# Patient Record
Sex: Female | Born: 1990 | Race: White | Hispanic: No | Marital: Married | Smoking: Never smoker
Health system: Southern US, Community
[De-identification: ages and names within clinical notes are randomized; demographics above are authoritative.]

## PROBLEM LIST (undated history)

## (undated) DIAGNOSIS — R51 Headache: Secondary | ICD-10-CM

## (undated) DIAGNOSIS — O09299 Supervision of pregnancy with other poor reproductive or obstetric history, unspecified trimester: Secondary | ICD-10-CM

## (undated) DIAGNOSIS — K900 Celiac disease: Secondary | ICD-10-CM

## (undated) DIAGNOSIS — R569 Unspecified convulsions: Secondary | ICD-10-CM

## (undated) DIAGNOSIS — F32A Depression, unspecified: Secondary | ICD-10-CM

## (undated) DIAGNOSIS — F419 Anxiety disorder, unspecified: Secondary | ICD-10-CM

## (undated) DIAGNOSIS — F909 Attention-deficit hyperactivity disorder, unspecified type: Secondary | ICD-10-CM

## (undated) DIAGNOSIS — F429 Obsessive-compulsive disorder, unspecified: Secondary | ICD-10-CM

## (undated) HISTORY — DX: Headache: R51

## (undated) HISTORY — DX: Unspecified convulsions: R56.9

## (undated) HISTORY — DX: Anxiety disorder, unspecified: F41.9

## (undated) HISTORY — PX: OTHER SURGICAL HISTORY: SHX169

## (undated) HISTORY — DX: Supervision of pregnancy with other poor reproductive or obstetric history, unspecified trimester: O09.299

## (undated) HISTORY — PX: TONSILLECTOMY: SUR1361

## (undated) HISTORY — DX: Obsessive-compulsive disorder, unspecified: F42.9

## (undated) HISTORY — DX: Attention-deficit hyperactivity disorder, unspecified type: F90.9

## (undated) HISTORY — DX: Depression, unspecified: F32.A

## (undated) HISTORY — PX: TOOTH EXTRACTION: SUR596

---
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
## 2010-01-02 ENCOUNTER — Ambulatory Visit: Payer: Self-pay | Admitting: Pediatrics

## 2010-07-03 ENCOUNTER — Institutional Professional Consult (permissible substitution) (INDEPENDENT_AMBULATORY_CARE_PROVIDER_SITE_OTHER): Payer: BC Managed Care – PPO | Admitting: Pediatrics

## 2010-07-03 DIAGNOSIS — F411 Generalized anxiety disorder: Secondary | ICD-10-CM

## 2010-07-03 DIAGNOSIS — F909 Attention-deficit hyperactivity disorder, unspecified type: Secondary | ICD-10-CM

## 2011-03-24 ENCOUNTER — Institutional Professional Consult (permissible substitution) (INDEPENDENT_AMBULATORY_CARE_PROVIDER_SITE_OTHER): Payer: BC Managed Care – PPO | Admitting: Pediatrics

## 2011-03-24 DIAGNOSIS — F909 Attention-deficit hyperactivity disorder, unspecified type: Secondary | ICD-10-CM

## 2011-03-24 DIAGNOSIS — F411 Generalized anxiety disorder: Secondary | ICD-10-CM

## 2011-04-26 ENCOUNTER — Institutional Professional Consult (permissible substitution) (INDEPENDENT_AMBULATORY_CARE_PROVIDER_SITE_OTHER): Payer: BC Managed Care – PPO | Admitting: Pediatrics

## 2011-04-26 DIAGNOSIS — F411 Generalized anxiety disorder: Secondary | ICD-10-CM

## 2011-10-13 ENCOUNTER — Institutional Professional Consult (permissible substitution) (INDEPENDENT_AMBULATORY_CARE_PROVIDER_SITE_OTHER): Payer: BC Managed Care – PPO | Admitting: Pediatrics

## 2011-10-13 DIAGNOSIS — F411 Generalized anxiety disorder: Secondary | ICD-10-CM

## 2011-10-13 DIAGNOSIS — F909 Attention-deficit hyperactivity disorder, unspecified type: Secondary | ICD-10-CM

## 2012-01-26 ENCOUNTER — Institutional Professional Consult (permissible substitution) (INDEPENDENT_AMBULATORY_CARE_PROVIDER_SITE_OTHER): Payer: BC Managed Care – PPO | Admitting: Pediatrics

## 2012-01-26 DIAGNOSIS — F411 Generalized anxiety disorder: Secondary | ICD-10-CM

## 2012-01-26 DIAGNOSIS — F909 Attention-deficit hyperactivity disorder, unspecified type: Secondary | ICD-10-CM

## 2012-01-27 ENCOUNTER — Institutional Professional Consult (permissible substitution): Payer: BC Managed Care – PPO | Admitting: Pediatrics

## 2012-04-20 ENCOUNTER — Institutional Professional Consult (permissible substitution) (INDEPENDENT_AMBULATORY_CARE_PROVIDER_SITE_OTHER): Payer: BC Managed Care – PPO | Admitting: Pediatrics

## 2012-04-20 DIAGNOSIS — F411 Generalized anxiety disorder: Secondary | ICD-10-CM

## 2012-04-20 DIAGNOSIS — F909 Attention-deficit hyperactivity disorder, unspecified type: Secondary | ICD-10-CM

## 2012-05-10 ENCOUNTER — Encounter (HOSPITAL_COMMUNITY): Payer: Self-pay | Admitting: Psychiatry

## 2012-05-10 ENCOUNTER — Ambulatory Visit (INDEPENDENT_AMBULATORY_CARE_PROVIDER_SITE_OTHER): Payer: BC Managed Care – PPO | Admitting: Psychiatry

## 2012-05-10 DIAGNOSIS — F3289 Other specified depressive episodes: Secondary | ICD-10-CM

## 2012-05-10 DIAGNOSIS — F329 Major depressive disorder, single episode, unspecified: Secondary | ICD-10-CM

## 2012-05-10 DIAGNOSIS — F429 Obsessive-compulsive disorder, unspecified: Secondary | ICD-10-CM

## 2012-05-12 NOTE — Patient Instructions (Signed)
Discussed orally 

## 2012-05-12 NOTE — Progress Notes (Signed)
Patient:   Theresa Jackson   DOB:   1990/09/22  MR Number:  [id number]  Location:  [address] [place], [place] [address]  Date of Service:   Wednesday 05/10/2012  Start Time:   10:20 AM End Time:   11:15 AM  Provider/Observer:  Florencia Reasons, MSW, LCSW   Billing Code/Service:  [phone]  Chief Complaint:     Chief Complaint  Patient presents with  . Anxiety  . Depression    Reason for Service:  The patient is referred for services by Dr Loraine Leriche due to patient experiencing symptoms of anxiety and depression. Patient  reports being diagnosed with ADHD, OCD, and CAPD by Dr. Kem Kays in the 6th grade. Symptoms have worsened as she has aged. She  states being so "paranoid"  about her past that it is interfering with her daily functioning. She reports sending pictures of herself dressed in her underwear to a guy 3 or 4 years ago. She now constantly fears he will share the pictures, she will lose her job,  and go to jail. She also reports a guy sent her pictures of himself naked several years ago. She checks her flash drive and her phone constantly to make certain the pictures aren't there. Patient also reports thinking if she doesn't touch her flash drive a certain number of times, something bad is going to happen She checks electronics and appliances in her home several times in a certain order every morning and says this process takes about 25 minutes. 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 She also shares memories from childhood where she reports worrying and pulling her hair so frequently that she had open sores in her head. She also remembers asking her father to check her pocketbook each night before she went to bed. Patient reports being so overwhelmed with anxiety and her OCD tendencies that she becomes depressed and thinks she shouldn't  be here.   Current Status:  The patient  reports depressed mood, anxiety, racing thoughts, irritability, excessive worrying, low energy, excessive thoughts, ritualistic behaviors, poor concentration, and hyperactivity  Reliability of Information: Reliable  Behavioral Observation: Theresa Jackson  presents as a 22 y.o.-year-old right handed Caucasian Female who appeared her stated age. Her dress was appropriate and she was casual in attire.  Her  manners were appropriate to the situation.  There were not any physical disabilities noted.  She displayed an appropriate level of cooperation and motivation.    Interactions:    Active   Attention:   within normal limits  Memory:   Immediate memory - recalled 2/3 words.  Visuo-spatial:   within normal limits  Speech (Volume):  normal  Speech:   normal pitch and normal volume  Thought Process:  Coherent and Relevant  Though Content:  Obsessions  Orientation:   person, place, time/date, situation, day of week, month of year and year  Judgment:   Good  Planning:   Good  Affect:    Anxious  Mood:    Anxious and Depressed  Insight:   Fair  Intelligence:   normal  Marital Status/Living: The patient was born in [place] and reared in [place]. She is the oldest of 2 siblings. She describes her household in childhood as  family was happy, they camped and did fun things. Patient and her husband have been married for 5 months. They reside in  [place]. They have no children.  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  Past Employment:   Substance Use:  No concerns of substance abuse are reported.   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  Medical History:  History reviewed. No pertinent past medical history.  Sexual History:   History  Sexual Activity  . Sexually Active:     Abuse/Trauma History: Denies  Psychiatric  History:  Patient reports no psychiatric hospitalizations. She has been seeing Dr.Thomas Kem Kays for outpatient treatment since she was in the 6th grade at which time she was diagnosed with ADHD, OCD, and CAPD. Patient has taken Focalin and Strattera in the past. She currently is taking Zoloft and buspirone as prescribed by Dr. Kem Kays.   Family Med/Psych History:  Family History  Problem Relation Age of Onset  . Depression Maternal Grandfather     Risk of Suicide/Violence: Patient denies any suicidal attempts. She admits hopelessness at times but denies past and current suicidal ideations. She denies past and current homicidal ideations. She reports no history of aggression or violence,  Impression/DX:  The patient presents with a long-standing history of symptoms of anxiety that have worsened with age. She currently is experiencing obsessions and compulsions that are interfering with her daily functioning and consuming a significant amount of time. Patient also is experiencing depressed mood related to feeling overwhelmed with the obsessions and compulsions. Patient also presents with a history of being diagnosed with ADHD and CAPD. Diagnoses: Obsessive-compulsive disorder, depressive disorder NOS, ADHD by history  Disposition/Plan:  Patient attends the assessment appointment today. Confidentiality and limits are discussed. The patient agrees to return for an appointment in one week for continuing assessment and treatment planning. She currently is taking buspirone and Zoloft as prescribed by Dr. Loraine Leriche and will continue to see him for medication management. The patient agrees to call this practice, call 911, or have someone take her to the emergency room should symptoms worsen.  Diagnosis:    Axis I:   1. OCD (obsessive compulsive disorder)   2. Depressive disorder, not elsewhere classified         Axis II: Deferred       Axis III:  See medical history      Axis IV:            Axis V:   51-60 moderate symptoms

## 2012-05-18 ENCOUNTER — Ambulatory Visit (HOSPITAL_COMMUNITY): Payer: Self-pay | Admitting: Psychiatry

## 2012-05-25 ENCOUNTER — Ambulatory Visit (INDEPENDENT_AMBULATORY_CARE_PROVIDER_SITE_OTHER): Payer: BC Managed Care – PPO | Admitting: Psychiatry

## 2012-05-25 DIAGNOSIS — F429 Obsessive-compulsive disorder, unspecified: Secondary | ICD-10-CM

## 2012-05-25 DIAGNOSIS — F329 Major depressive disorder, single episode, unspecified: Secondary | ICD-10-CM

## 2012-05-25 NOTE — Progress Notes (Signed)
Patient:  Theresa Jackson   DOB: [birth year]  MR Number: [id number]  Location: [facility]:  [address] [place],  [place], [address]  Start: Thursday 05/25/2012 9:10 AM End: Thursday 05/25/2012 10:00 AM  Provider/Observer:     Florencia Reasons, MSW, LCSW   Chief Complaint:      Chief Complaint  Patient presents with  . Anxiety  . Depression   Reason For Service:      The patient is referred for services by Dr Loraine Leriche due to patient experiencing symptoms of anxiety and depression. 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 Symptoms have worsened as she has aged. She states being so "paranoid" about her past that it is interfering with her daily functioning. She reports sending pictures of herself dressed in her underwear to a guy 3 or 4 years ago. She now constantly fears he will share the pictures, she will lose her job, and go to jail. She also reports a guy sent her pictures of himself naked several years ago. She checks her flash drive and her phone constantly to make certain the pictures aren't there. Patient also reports thinking if she doesn't touch her flash drive a certain number of times, something bad is going to happen She checks electronics and appliances in her home several times in a certain order every morning and says this process takes about 25 minutes. 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 She also shares memories from childhood where she reports worrying and pulling her hair so frequently that she had open sores in her head. She also remembers asking her father to check her pocketbook each night before she went to bed. Patient reports being so overwhelmed with anxiety and her OCD tendencies that she becomes depressed and thinks she shouldn't be here. Patient is seen for follow up  appointment.  Interventions Strategy:  Supportive therapy  Participation Level:   Active  Participation Quality:  Appropriate      Behavioral Observation:  Casual, Alert, and Appropriate.   Current Psychosocial Factors: Patient reports stress regarding financial issues in her marriage. She also reports work related stress.  Content of Session:   Establishing rapport, reviewing symptoms, processing feelings, identifying ways to improve self-care regarding  nutrition and exercise.  Current Status:   The patient reports less depressed mood and less worry about her past but continued  anxiety, racing thoughts,  excessive worrying, ritualistic behaviors, poor concentration, and hyperactivity  Patient Progress:   Fair. Patient reports feeling better since last session. She states being less paranoid about her past since being able to talk in session. She continues to report stress related to her job. She also shares with therapist that she experiences mood swings and states sometimes being  "super happy" and sometimes "super sad". When happy, she reports being hyperactive, running around, and being unable to slow down  been sad, she reports losing interest in sex and being down but also wanting to clean excessively..Patient also reports worry that she seems to be hungry all the time. She fears weight gain. She reports exercising and eating healthy.  Target Goals:   Establishing rapport  Last Reviewed:    Goals Addressed Today:    Establishing rapport  Impression/Diagnosis:   *The patient presents with a long-standing history of symptoms of anxiety that have worsened  with age. She currently is experiencing obsessions and compulsions that are interfering with her daily functioning and consuming a significant amount of time. Patient also is experiencing depressed mood related to feeling overwhelmed with the obsessions and compulsions. Patient also shares a history of mood swings and states being super  happy [place] being super sad. Patient also presents with a history of being diagnosed with ADHD and CAPD.  Diagnoses: Obsessive-compulsive disorder, depressive disorder NOS, rule out bipolar disorder ADHD by history   Diagnosis:  Axis I: OCD (obsessive compulsive disorder)  Depressive disorder, not elsewhere classified          Axis II: Deferred

## 2012-05-25 NOTE — Patient Instructions (Signed)
Discussed orally 

## 2012-06-01 ENCOUNTER — Ambulatory Visit (HOSPITAL_COMMUNITY): Payer: Self-pay | Admitting: Psychiatry

## 2012-06-02 ENCOUNTER — Ambulatory Visit (INDEPENDENT_AMBULATORY_CARE_PROVIDER_SITE_OTHER): Payer: BC Managed Care – PPO | Admitting: Psychiatry

## 2012-06-02 DIAGNOSIS — F429 Obsessive-compulsive disorder, unspecified: Secondary | ICD-10-CM

## 2012-06-02 DIAGNOSIS — F329 Major depressive disorder, single episode, unspecified: Secondary | ICD-10-CM

## 2012-06-02 NOTE — Progress Notes (Addendum)
Patient:  Theresa Jackson   DOB: Jun 09, 1990  MR Number: [id number]  Location: [facility]:  [address] [place],  [place], [address]  Start: Friday 06/02/2012 9:00 AM End: Friday 06/02/2012 9:55 AM  Provider/Observer:     Florencia Reasons, MSW, LCSW   Chief Complaint:      Chief Complaint  Patient presents with  . Anxiety  . Depression   Reason For Service:      The patient is referred for services by Dr Loraine Leriche due to patient experiencing symptoms of anxiety and depression. 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 Symptoms have worsened as she has aged. She states being so "paranoid" about her past that it is interfering with her daily functioning. She reports sending pictures of herself dressed in her underwear to a guy 3 or 4 years ago. She now constantly fears he will share the pictures, she will lose her job, and go to jail. She also reports a guy sent her pictures of himself naked several years ago. She checks her flash drive and her phone constantly to make certain the pictures aren't there. Patient also reports thinking if she doesn't touch her flash drive a certain number of times, something bad is going to happen She checks electronics and appliances in her home several times in a certain order every morning and says this process takes about 25 minutes. 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 She also shares memories from childhood where she reports worrying and pulling her hair so frequently that she had open sores in her head. She also remembers asking her father to check her pocketbook each night before she went to bed. Patient reports being so overwhelmed with anxiety and her OCD tendencies that she becomes depressed and thinks she shouldn't be here. Patient is seen for follow up appointment.  Interventions  Strategy:  Supportive therapy,   Participation Level:   Active  Participation Quality:  Appropriate      Behavioral Observation:  Casual, Alert, and Appropriate.   Current Psychosocial Factors: Patient reports recent argument with husband. She reports work related stress to interaction with a co-worker.  Content of Session:    reviewing symptoms, processing feelings, developing treatment plan, practicing relaxation technique, discussing referral to psychiatrist for medication evaluation  Current Status:   The patient reports improved mood but continued  anxiety, racing thoughts,  excessive worrying, ritualistic behaviors, poor concentration, and hyperactivity  Patient Progress:   Fair. 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 She also reports stress related to a coworker who tends to nag. She reports additional stress related to her marriage and reports that her husband can be very controlling. She also expresses frustration that husband is not more involved with performing household chores. Therapist works with patient to process her feelings and to practice a relaxation technique using diaphragmatic breathing. Patient continues to experience obsessions and compulsions along with paranoia. She reports feeling bad about her thoughts and reports poor self-esteem. She also reports often taking the blame in arguments in her marriage. Therapist works with patient to develop a treatment plan.   Target Goals:  Patient's words: "Stop checking everything, stop having weird thoughts in my head, stop letting  people get to me, increase my  self-esteem"  1. Decrease intensity and frequency of obsessions and compulsions: 1:1 psychotherapy one time every 1-4 weeks (supportive, cognitive behavioral therapy)   2. Improve assertiveness skills and ability to set and maintain boundaries: 1:1 psychotherapy one time every 1-4 weeks (supportive,  cognitive behavior therapy)   3. Increase self acceptance: 1:1 psychotherapy one time every 1-4 weeks (supportive, cognitive behavioral therapy)  Last Reviewed:  06/02/2012   Goals Addressed Today:    Goal 1  Impression/Diagnosis:   *The patient presents with a long-standing history of symptoms of anxiety that have worsened with age. She currently is experiencing obsessions and compulsions that are interfering with her daily functioning and consuming a significant amount of time. Patient also is experiencing depressed mood related to feeling overwhelmed with the obsessions and compulsions. Patient also shares a history of mood swings and states being super happy [place] being super sad. Patient also presents with a history of being diagnosed with ADHD and CAPD.  Diagnoses: Obsessive-compulsive disorder, depressive disorder NOS, rule out bipolar disorder ADHD by history   Diagnosis:  Axis I: OCD (obsessive compulsive disorder)  Depressive disorder, not elsewhere classified          Axis II: Deferred

## 2012-06-02 NOTE — Patient Instructions (Signed)
Discussed orally 

## 2012-06-05 ENCOUNTER — Ambulatory Visit (INDEPENDENT_AMBULATORY_CARE_PROVIDER_SITE_OTHER): Payer: BC Managed Care – PPO | Admitting: Psychiatry

## 2012-06-05 DIAGNOSIS — F429 Obsessive-compulsive disorder, unspecified: Secondary | ICD-10-CM

## 2012-06-05 DIAGNOSIS — F329 Major depressive disorder, single episode, unspecified: Secondary | ICD-10-CM

## 2012-06-05 NOTE — Progress Notes (Addendum)
Patient:  Theresa Jackson   DOB: [birth year]  MR Number: [id number]  Location: [facility]:  [address] [place],  [place], [address]  Start: Monday 06/05/2012 9:10 AM End: Monday 06/05/2012 10:00 AM  Provider/Observer:     Florencia Reasons, MSW, LCSW   Chief Complaint:      Chief Complaint  Patient presents with  . Anxiety  . Depression   Reason For Service:      The patient is referred for services by Dr Loraine Leriche due to patient experiencing symptoms of anxiety and depression. 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 Symptoms have worsened as she has aged. She states being so "paranoid" about her past that it is interfering with her daily functioning. She reports sending pictures of herself dressed in her underwear to a guy 3 or 4 years ago. She now constantly fears he will share the pictures, she will lose her job, and go to jail. She also reports a guy sent her pictures of himself naked several years ago. She checks her flash drive and her phone constantly to make certain the pictures aren't there. Patient also reports thinking if she doesn't touch her flash drive a certain number of times, something bad is going to happen She checks electronics and appliances in her home several times in a certain order every morning and says this process takes about 25 minutes. 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 She also shares memories from childhood where she reports worrying and pulling her hair so frequently that she had open sores in her head. She also remembers asking her father to check her pocketbook each night before she went to bed. Patient reports being so overwhelmed with anxiety and her OCD tendencies that she becomes depressed and thinks she shouldn't be here. Patient is seen for follow up appointment.  Interventions  Strategy:  Supportive therapy,   Participation Level:   Active  Participation Quality:  Appropriate      Behavioral Observation:  Casual, Alert, and anxious, depressed  Current Psychosocial Factors: Patient reports recent argument with husband.   Content of Session:    Reviewing symptoms, processing feelings, beginning to explore thought patterns and effects on mood and behavior, encouraging patient to use relaxation technique  Current Status:   The patient reports depressed mood and  continued  anxiety, racing thoughts,  excessive worrying, ritualistic behaviors, poor concentration, and hyperactivity  Patient Progress:   Fair. Patient reports increased stress this weekend as she and her husband continued to experience marital discord. Patient states her husband" puts her down". Therapist works with patient to explore her thought patterns and effects on patient's mood and behavior. Patient reports poor self-esteem since childhood and states thinking she isn't as good as other people. Patient shares negative experiences from childhood. Therapist works with patient to process her feelings as well as to identify strengths patient used during childhood and effects on patient's current functioning. Therapist also begins to work with patient to identify specific obsessions and compulsions. Therapist and patient also discuss patient scheduling an earlier appointment with Dr. Dan Humphreys as patient will run out of medication this Friday.  Target Goals:  Patient's words: "Stop checking everything, stop having weird thoughts in my head, stop letting  people get to me, increase my  self-esteem"  1. Decrease intensity and frequency of obsessions and compulsions: 1:1 psychotherapy one time every 1-4 weeks (supportive, cognitive behavioral therapy)   2. Improve assertiveness skills and ability to set and maintain boundaries: 1:1 psychotherapy one time every 1-4 weeks (supportive, cognitive behavior  therapy)   3. Increase self acceptance: 1:1 psychotherapy one time every 1-4 weeks (supportive, cognitive behavioral therapy)  Last Reviewed:  06/02/2012   Goals Addressed Today:    Goal 3  Impression/Diagnosis:   The patient presents with a long-standing history of symptoms of anxiety that have worsened with age. She currently is experiencing obsessions and compulsions that are interfering with her daily functioning and consuming a significant amount of time. Patient also is experiencing depressed mood related to feeling overwhelmed with the obsessions and compulsions. Patient also shares a history of mood swings and states being super happy and then being super sad. Patient also presents with a history of being diagnosed with ADHD and CAPD.  Diagnoses: Obsessive-compulsive disorder, depressive disorder NOS, rule out bipolar disorder ADHD by history   Diagnosis:  Axis I: OCD (obsessive compulsive disorder)  Depressive disorder, not elsewhere classified          Axis II: Deferred

## 2012-06-05 NOTE — Patient Instructions (Signed)
Discussed orally 

## 2012-06-09 ENCOUNTER — Ambulatory Visit (HOSPITAL_COMMUNITY): Payer: Self-pay | Admitting: Psychiatry

## 2012-06-13 ENCOUNTER — Ambulatory Visit (HOSPITAL_COMMUNITY): Payer: Self-pay | Admitting: Psychiatry

## 2012-06-16 ENCOUNTER — Ambulatory Visit (INDEPENDENT_AMBULATORY_CARE_PROVIDER_SITE_OTHER): Payer: BC Managed Care – PPO | Admitting: Psychiatry

## 2012-06-16 ENCOUNTER — Encounter (HOSPITAL_COMMUNITY): Payer: Self-pay | Admitting: Psychiatry

## 2012-06-16 VITALS — Wt 130.0 lb

## 2012-06-16 DIAGNOSIS — F988 Other specified behavioral and emotional disorders with onset usually occurring in childhood and adolescence: Secondary | ICD-10-CM

## 2012-06-16 DIAGNOSIS — F3160 Bipolar disorder, current episode mixed, unspecified: Secondary | ICD-10-CM

## 2012-06-16 DIAGNOSIS — F429 Obsessive-compulsive disorder, unspecified: Secondary | ICD-10-CM

## 2012-06-16 DIAGNOSIS — F411 Generalized anxiety disorder: Secondary | ICD-10-CM

## 2012-06-16 DIAGNOSIS — F314 Bipolar disorder, current episode depressed, severe, without psychotic features: Secondary | ICD-10-CM | POA: Insufficient documentation

## 2012-06-16 DIAGNOSIS — Z820 Family history of epilepsy and other diseases of the nervous system: Secondary | ICD-10-CM | POA: Insufficient documentation

## 2012-06-16 DIAGNOSIS — F909 Attention-deficit hyperactivity disorder, unspecified type: Secondary | ICD-10-CM | POA: Insufficient documentation

## 2012-06-16 MED ORDER — BUSPIRONE HCL 15 MG PO TABS: 15.0000 mg | ORAL_TABLET | Freq: Four times a day (QID) | ORAL | Status: DC

## 2012-06-16 MED ORDER — PROPRANOLOL HCL 10 MG PO TABS: 10.0000 mg | ORAL_TABLET | Freq: Three times a day (TID) | ORAL | Status: DC

## 2012-06-16 MED ORDER — FLUOXETINE HCL 10 MG PO TABS: ORAL_TABLET | ORAL | Status: DC

## 2012-06-16 NOTE — Progress Notes (Signed)
Psychiatric Assessment Adult  Patient Identification:  Theresa Jackson Date of Evaluation:  06/16/2012 Start Time: 10:05 AM End Time: 11:20 AM  Chief Complaint: "I'm needing something different". Chief Complaint  Patient presents with  . Anxiety  . Depression  . Establish Care   History of Chief Complaint:   In 3rd grade was referred for eval for ADD and poor concentration.  In the course fo treatment has been on Ritalin (made a zombie, no appetite, in own little world), Adderall (lost appetite), Concerta (may have made more paranoid), Daytrana (rash from this made her paranoid), Focalin, Vyvanse, Zoloft (not help too much with depression), BuSpar (since 10th grade moderate benefits), Strattera.  She did note feeling great on her first dose of Xanax, but does not like the starring off into space feeling with alcohol.  She noted relief from her paranoia when she went to a [employer].   Scratches her head to the point that her hair falls out and some areas are where hair does not grow any more.  Perhaps they have gotten better.   Anxiety Symptoms include decreased concentration, dizziness, nervous/anxious behavior and suicidal ideas. Patient reports no confusion.     Review of Systems  Constitutional: Negative.   Eyes: Positive for itching.  Respiratory: Negative.   Cardiovascular: Negative.   Gastrointestinal: Positive for diarrhea and constipation.  Endocrine: Positive for polyphagia. Negative for cold intolerance, heat intolerance, polydipsia and polyuria.  Genitourinary: Positive for vaginal discharge.  Musculoskeletal: Negative.   Neurological: Positive for dizziness, syncope, light-headedness and headaches. Negative for tremors, seizures, facial asymmetry, speech difficulty, weakness and numbness.  Psychiatric/Behavioral: Positive for suicidal ideas, sleep disturbance, dysphoric mood and decreased concentration. Negative for hallucinations, behavioral problems, confusion,  self-injury and agitation. The patient is nervous/anxious and is hyperactive.    Physical Exam  Depressive Symptoms: anhedonia, feelings of worthlessness/guilt, hopelessness, recurrent thoughts of death, suicidal thoughts without plan, hypersomnia,  (Hypo) Manic Symptoms:   Elevated Mood:  Yes Irritable Mood:  No Grandiosity:  No Distractibility:  Yes Labiality of Mood:  Yes Delusions:  Yes Hallucinations:  No Impulsivity:  Yes Sexually Inappropriate Behavior:  No Financial Extravagance:  No Flight of Ideas:  Yes  Anxiety Symptoms: Excessive Worry:  Yes Panic Symptoms:  Yes Agoraphobia:  No Obsessive Compulsive: Yes  Symptoms: Checking, Counting, songs stuck in head on cyclic basis Specific Phobias:  No Social Anxiety:  No  Psychotic Symptoms:  Hallucinations: No  Delusions:  Yes Paranoia:  Yes   Ideas of Reference:  Yes  PTSD Symptoms: Ever had a traumatic exposure:  No Had a traumatic exposure in the last month:  No  Traumatic Brain Injury: No   Past Psychiatric History: Diagnosis: ADHD, Central Auditory Processing Difficulty, OCD  Hospitalizations: none  Outpatient Care: Dr. Verdie Mosher  Substance Abuse Care: none  Self-Mutilation: scratch head by compulsion  Suicidal Attempts: none  Violent Behaviors: none   Past Medical History:   Past Medical History  Diagnosis Date  . ADHD (attention deficit hyperactivity disorder)   . Obsessive-compulsive disorder   . Seizures    History of Loss of Consciousness:  No Seizure History:  Yes on Depakote from age 51 to 66 Cardiac History:  No Allergies:  No Known Allergies Current Medications:  Current Outpatient Prescriptions  Medication Sig Dispense Refill  . atomoxetine (STRATTERA) 100 MG capsule Take 200 mg by mouth daily.      . busPIRone (BUSPAR) 15 MG tablet Take 15 mg by mouth 3 (three) times daily.      Marland Kitchen  sertraline (ZOLOFT) 100 MG tablet Take 100 mg by mouth daily.       No current facility-administered  medications for this visit.    Previous Psychotropic Medications:  Medication Dose   see HPI                       Substance Abuse History in the last 12 months: Substance Age of 1st Use Last Use Amount Specific Type  Nicotine  19  19  once    Alcohol  21  2 weeks ago      Cannabis  none        Opiates  none        Cocaine  none        Methamphetamines  none        LSD  none        Ecstasy  none         Benzodiazepines  [age]      Caffeine  childhood  this week      Inhalants  none        Others:       sugar  childhood  this AM                  Medical Consequences of Substance Abuse: none  Legal Consequences of Substance Abuse: none  Family Consequences of Substance Abuse: none  Social History: Current Place of Residence: [address] [place] [address] Place of Birth: [place], [place] Family Members: husband Marital Status:  Married Children: 0  Sons: 0  Daughters: 0 Relationships: spouse Education:  Corporate treasurer Problems/Performance: learning disability Religious Beliefs/Practices: Tax adviser History of Abuse: none Teacher, music History:  None. Legal History: none Hobbies/Interests:   Family History:   Family History  Problem Relation Age of Onset  . Depression Maternal Grandfather   . Anxiety disorder Mother   . ADD / ADHD Brother   . ADD / ADHD Maternal Aunt   . Alcohol abuse Maternal Grandmother   . Bipolar disorder Maternal Grandmother   . Dementia Paternal Grandmother   . Seizures Cousin   . Drug abuse Neg Hx   . OCD Neg Hx   . Paranoid behavior Neg Hx   . Schizophrenia Neg Hx   . Sexual abuse Neg Hx   . Physical abuse Neg Hx   . ADD / ADHD Maternal Aunt     Mental Status Examination/Evaluation: Objective:  Appearance: Casual  Eye Contact::  Good  Speech:  Clear and Coherent  Volume:  Normal  Mood:  Slight anxiety  Affect:  Congruent  Thought Process:  Coherent, Intact and Logical   Orientation:  Full (Time, Place, and Person)  Thought Content:  very mild level of suspicion  Suicidal Thoughts:  No  Homicidal Thoughts:  No  Judgement:  Good  Insight:  Good  Psychomotor Activity:  Normal  Akathisia:  No  Handed:  Right  AIMS (if indicated):    Assets:  Communication Skills Desire for Improvement    Laboratory/X-Ray Psychological Evaluation(s)   none  none   Assessment:    AXIS I ADHD, inattentive type, Bipolar, mixed, Generalized Anxiety Disorder and Obsessive Compulsive Disorder  AXIS II Deferred  AXIS III Past Medical History  Diagnosis Date  . ADHD (attention deficit hyperactivity disorder)   . Obsessive-compulsive disorder   . Seizures      AXIS IV other psychosocial or environmental problems  AXIS V 51-60 moderate symptoms  Treatment Plan/Recommendations:  Laboratory:  Vitamin D level with labs  Psychotherapy: supportive, CBT, and probably couples counesling  Medications: Inderal, Prozac, continue Buspar at higher dose  Routine PRN Medications:  No  Consultations: none  Safety Concerns:  none  Other:     Plan/Discussion: I took her vitals.  I reviewed CC, tobacco/med/surg Hx, meds effects/ side effects, problem list, therapies and responses as well as current situation/symptoms discussed options. See above for meds and counseling recc See orders and pt instructions for more details.  Medical Decision Making Problem Points:  New problem, with additional work-up planned (4), New problem, with no additional work-up planned (3) and Review of psycho-social stressors (1) Data Points:  Review or order clinical lab tests (1) Review of medication regiment & side effects (2) Review of new medications or change in dosage (2)  I certify that outpatient services furnished can reasonably be expected to improve the patient's condition.   Orson Aloe, MD, [facility]

## 2012-06-16 NOTE — Patient Instructions (Addendum)
Try Scalpisin for the scalp  Try 1/4 teaspoon on a cup of water of CREAM OF TARTAR daily for the yeast infections  Check out the [facility] for some ideas of who all works with autistic kids and young adults.  Call if problems or concerns.

## 2012-06-20 ENCOUNTER — Telehealth (HOSPITAL_COMMUNITY): Payer: Self-pay | Admitting: Psychiatry

## 2012-06-20 ENCOUNTER — Ambulatory Visit (INDEPENDENT_AMBULATORY_CARE_PROVIDER_SITE_OTHER): Payer: BC Managed Care – PPO | Admitting: Psychiatry

## 2012-06-20 DIAGNOSIS — F429 Obsessive-compulsive disorder, unspecified: Secondary | ICD-10-CM

## 2012-06-20 DIAGNOSIS — F909 Attention-deficit hyperactivity disorder, unspecified type: Secondary | ICD-10-CM

## 2012-06-20 DIAGNOSIS — F329 Major depressive disorder, single episode, unspecified: Secondary | ICD-10-CM

## 2012-06-20 MED ORDER — ATOMOXETINE HCL 100 MG PO CAPS: 200.0000 mg | ORAL_CAPSULE | Freq: Every day | ORAL | Status: DC

## 2012-06-20 NOTE — Addendum Note (Signed)
Addended by: Mike Craze on: 06/20/2012 09:41 AM   Modules accepted: Orders

## 2012-06-20 NOTE — Progress Notes (Addendum)
Patient:  Theresa Jackson   DOB: 30-Aug-1990  MR Number: [id number]  Location: [facility]:  [address] [place],  [place], [address]  Start: Tuesday 06/20/2012 9:05  AM End: Tuesday 06/20/2012 9:40  AM  Provider/Observer:     Florencia Reasons, MSW, LCSW   Chief Complaint:      Chief Complaint  Patient presents with  . Anxiety  . Depression   Reason For Service:      The patient is referred for services by Dr Loraine Leriche due to patient experiencing symptoms of anxiety and depression. 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 Symptoms have worsened as she has aged. She states being so "paranoid" about her past that it is interfering with her daily functioning. She reports sending pictures of herself dressed in her underwear to a guy 3 or 4 years ago. She now constantly fears he will share the pictures, she will lose her job, and go to jail. She also reports a guy sent her pictures of himself naked several years ago. She checks her flash drive and her phone constantly to make certain the pictures aren't there. Patient also reports thinking if she doesn't touch her flash drive a certain number of times, something bad is going to happen She checks electronics and appliances in her home several times in a certain order every morning and says this process takes about 25 minutes. 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 She also shares memories from childhood where she reports worrying and pulling her hair so frequently that she had open sores in her head. She also remembers asking her father to check her pocketbook each night before she went to bed. Patient reports being so overwhelmed with anxiety and her OCD tendencies that she becomes depressed and thinks she shouldn't be here. Patient is seen for follow up appointment.  Interventions  Strategy:  Supportive therapy, cognitive behavioral therapy  Participation Level:   Active  Participation Quality:  Appropriate      Behavioral Observation:  Casual, Alert, and anxious, depressed  Current Psychosocial Factors: Patient reports increased work stress.  Content of Session:    Reviewing symptoms, processing feelings, identifying obsessions and compulsions and beginning to rank order by severity  Current Status:   The patient reports depressed mood and  continued  anxiety, racing thoughts,  excessive worrying, ritualistic behaviors, poor concentration, and hyperactivity  Patient Progress:   Fair. Patient reports increased work stress as a new child was assigned to patient's room yesterday. The child is in foster care and has regular visitation with his biological mother at the daycare. Patient and her coworker have been requested to monitor these visits by patient's employer. She reports this is overwhelming as this is an additional duty to her current responsibilities. Therapist works with patient to process her feelings and to explore coping and relaxation techniques. Patient reports increased worry about her weight. She states gaining 4 pounds despite exercising and eating healthy. Patient reports using an Insanity DVD daily and weighing herself at least 4 times daily. Therapist and patient discuss the obsessions and compulsions related to this and explore ways to intervene. Patient agrees to reduce the use of the frequency of the DVD and weighing self to every other day.  Target Goals:  Patient's words: "Stop checking everything, stop having weird thoughts  in my head, stop letting  people get to me, increase my  self-esteem"      1. Decrease intensity and frequency of obsessions and compulsions: 1:1 psychotherapy one time every 1-4 weeks (supportive, cognitive behavioral therapy)   2. Improve assertiveness skills and ability to set and maintain boundaries: 1:1 psychotherapy one time  every 1-4 weeks (supportive, cognitive behavior therapy)   3. Increase self acceptance: 1:1 psychotherapy one time every 1-4 weeks (supportive, cognitive behavioral therapy)  Last Reviewed:  06/02/2012   Goals Addressed Today:    Goal 3  Impression/Diagnosis:   The patient presents with a long-standing history of symptoms of anxiety that have worsened with age. She currently is experiencing obsessions and compulsions that are interfering with her daily functioning and consuming a significant amount of time. Patient also is experiencing depressed mood related to feeling overwhelmed with the obsessions and compulsions. Patient also shares a history of mood swings and states being super happy and then being super sad. Patient also presents with a history of being diagnosed with ADHD and CAPD.  Diagnoses: Obsessive-compulsive disorder, depressive disorder NOS, rule out bipolar disorder ADHD by history   Diagnosis:  Axis I: OCD (obsessive compulsive disorder)  Depressive disorder, not elsewhere classified  ADHD (attention deficit hyperactivity disorder)          Axis II: Deferred

## 2012-06-20 NOTE — Patient Instructions (Signed)
Discussed orally 

## 2012-06-20 NOTE — Telephone Encounter (Signed)
L/M that the Inderal may cause light headed ness.  That she should not be on both Prozac and Zoloft.  Invited her to call me back to discuss further that plan for the Zoloft.

## 2012-06-22 ENCOUNTER — Telehealth (HOSPITAL_COMMUNITY): Payer: Self-pay | Admitting: Psychiatry

## 2012-06-22 NOTE — Telephone Encounter (Signed)
Dialled pt's cell number and the voice that answered claimed to be [name], [name]'s mother.  Only gave general answers to questions and invited pt to invite pt to bring mother to the appointment.  The woman who answered gave me [name]'s cell phone number which wa exactly the number that I dialled and ended up getting the mother.  Perhaps pt's phone calls are being forwarded to the mother's phone.  Stated that mother could join in the discussion if pt signed a release or pt invited pt's mother to the next appointment.  Did not call the number a second time when I realized that I had already called the pt's number and ended up getting the mother.

## 2012-06-26 ENCOUNTER — Telehealth (HOSPITAL_COMMUNITY): Payer: Self-pay | Admitting: Psychiatry

## 2012-06-26 MED ORDER — SERTRALINE HCL 100 MG PO TABS: 250.0000 mg | ORAL_TABLET | Freq: Every day | ORAL | Status: DC

## 2012-06-26 NOTE — Telephone Encounter (Signed)
S/W pt and rewrote her being on Zoloft.  She is not on Strattera.  She notes side effects on the Prozac, but apparently that is with the Zoloft.  She asks about lithium, but when blood draws were mentioned she balked at that.

## 2012-06-27 ENCOUNTER — Ambulatory Visit (HOSPITAL_COMMUNITY): Payer: Self-pay | Admitting: Psychiatry

## 2012-07-03 ENCOUNTER — Ambulatory Visit (INDEPENDENT_AMBULATORY_CARE_PROVIDER_SITE_OTHER): Payer: BC Managed Care – PPO | Admitting: Psychiatry

## 2012-07-03 DIAGNOSIS — F909 Attention-deficit hyperactivity disorder, unspecified type: Secondary | ICD-10-CM

## 2012-07-03 DIAGNOSIS — F329 Major depressive disorder, single episode, unspecified: Secondary | ICD-10-CM

## 2012-07-03 DIAGNOSIS — F429 Obsessive-compulsive disorder, unspecified: Secondary | ICD-10-CM

## 2012-07-04 NOTE — Patient Instructions (Signed)
Discussed orally 

## 2012-07-04 NOTE — Progress Notes (Signed)
Patient:  Theresa Jackson   DOB: 1990-04-09  MR Number: [id number]  Location: [facility] Center:  [address] [place],  [place], [address]  Start: Monday 07/03/2012 9:15 AM End: Monday 07/03/2012 10:00 AM  Provider/Observer:     Florencia Reasons, MSW, LCSW   Chief Complaint:      Chief Complaint  Patient presents with  . Depression  . Anxiety   Reason For Service:      The patient is referred for services by Dr Loraine Leriche due to patient experiencing symptoms of anxiety and depression. 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 Symptoms have worsened as she has aged. She states being so "paranoid" about her past that it is interfering with her daily functioning. She reports sending pictures of herself dressed in her underwear to a guy 3 or 4 years ago. She now constantly fears he will share the pictures, she will lose her job, and go to jail. She also reports a guy sent her pictures of himself naked several years ago. She checks her flash drive and her phone constantly to make certain the pictures aren't there. Patient also reports thinking if she doesn't touch her flash drive a certain number of times, something bad is going to happen She checks electronics and appliances in her home several times in a certain order every morning and says this process takes about 25 minutes. 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 She also shares memories from childhood where she reports worrying and pulling her hair so frequently that she had open sores in her head. She also remembers asking her father to check her pocketbook each night before she went to bed. Patient reports being so overwhelmed with anxiety and her OCD tendencies that she becomes depressed and thinks she shouldn't be here. Patient is seen for follow up appointment.  Interventions  Strategy:  Supportive therapy, cognitive behavioral therapy  Participation Level:   Active  Participation Quality:  Appropriate      Behavioral Observation:  Casual, Alert, and fidgety, restless  Current Psychosocial Factors: Patient reports continued marital stress.  Content of Session:    Reviewing symptoms, processing feelings, discussing thought patterns and effects on mood and behavior, identifying and challenging cognitive distortions  Current Status:   The patient reports improved mood but continued  anxiety, racing thoughts,  excessive worrying, ritualistic behaviors, poor concentration, and hyperactivity  Patient Progress:   Fair. Patient reports depression and a 5 and anxiety around 7 or 8 on a 10 point scale with one being low and 10 being high. Patient continues to exhibit OCD tendencies regarding exercise but has reduced the amount of time she uses her insanity DVD.  She reports increased marital stress as her husband becomes upset when patient wants to hang out with her friends. Patient reports blaming herself and feeling guilty when husband becomes angry. Patient also expresses fear and obsessive thoughts that her marriage will fail. She also reports a tendency to become upset and become paranoid about her relationship with her friend if she does not hear from a friend. Therapist works with patient to examine thought patterns and the effects on patient's mood and behavior. Therapist works with patient to identify and challenge cognitive distortions. Patient's most frequent distortions include personalization, labeling, emotional reasoning, and jumping to conclusions.   Target Goals:  Patient's words: "Stop checking everything, stop having weird thoughts in my head, stop letting  people get to me, increase my  self-esteem"      1. Decrease intensity and frequency of obsessions and compulsions: 1:1 psychotherapy one time every 1-4 weeks (supportive, cognitive behavioral therapy)   2.  Improve assertiveness skills and ability to set and maintain boundaries: 1:1 psychotherapy one time every 1-4 weeks (supportive, cognitive behavior therapy)   3. Increase self acceptance: 1:1 psychotherapy one time every 1-4 weeks (supportive, cognitive behavioral therapy)  Last Reviewed:  06/02/2012   Goals Addressed Today:    Goals 1, 2, and 3  Impression/Diagnosis:   The patient presents with a long-standing history of symptoms of anxiety that have worsened with age. She currently is experiencing obsessions and compulsions that are interfering with her daily functioning and consuming a significant amount of time. Patient also is experiencing depressed mood related to feeling overwhelmed with the obsessions and compulsions. Patient also shares a history of mood swings and states being super happy and then being super sad. Patient also presents with a history of being diagnosed with ADHD and CAPD.  Diagnoses: Obsessive-compulsive disorder, depressive disorder NOS, rule out bipolar disorder ADHD by history   Diagnosis:  Axis I: OCD (obsessive compulsive disorder)  Depressive disorder, not elsewhere classified  ADHD (attention deficit hyperactivity disorder)          Axis II: Deferred

## 2012-07-12 ENCOUNTER — Institutional Professional Consult (permissible substitution): Payer: BC Managed Care – PPO | Admitting: Pediatrics

## 2012-07-12 DIAGNOSIS — F909 Attention-deficit hyperactivity disorder, unspecified type: Secondary | ICD-10-CM

## 2012-07-17 ENCOUNTER — Encounter (HOSPITAL_COMMUNITY): Payer: Self-pay | Admitting: Psychiatry

## 2012-07-17 ENCOUNTER — Ambulatory Visit (INDEPENDENT_AMBULATORY_CARE_PROVIDER_SITE_OTHER): Payer: BC Managed Care – PPO | Admitting: Psychiatry

## 2012-07-17 VITALS — Wt 129.2 lb

## 2012-07-17 DIAGNOSIS — Z820 Family history of epilepsy and other diseases of the nervous system: Secondary | ICD-10-CM

## 2012-07-17 DIAGNOSIS — F988 Other specified behavioral and emotional disorders with onset usually occurring in childhood and adolescence: Secondary | ICD-10-CM

## 2012-07-17 DIAGNOSIS — F909 Attention-deficit hyperactivity disorder, unspecified type: Secondary | ICD-10-CM

## 2012-07-17 DIAGNOSIS — F411 Generalized anxiety disorder: Secondary | ICD-10-CM

## 2012-07-17 DIAGNOSIS — F314 Bipolar disorder, current episode depressed, severe, without psychotic features: Secondary | ICD-10-CM

## 2012-07-17 DIAGNOSIS — F3160 Bipolar disorder, current episode mixed, unspecified: Secondary | ICD-10-CM

## 2012-07-17 DIAGNOSIS — F429 Obsessive-compulsive disorder, unspecified: Secondary | ICD-10-CM

## 2012-07-17 MED ORDER — SERTRALINE HCL 100 MG PO TABS: 300.0000 mg | ORAL_TABLET | Freq: Every day | ORAL | Status: DC

## 2012-07-17 MED ORDER — BUSPIRONE HCL 15 MG PO TABS: 15.0000 mg | ORAL_TABLET | Freq: Four times a day (QID) | ORAL | Status: DC

## 2012-07-17 MED ORDER — PROPRANOLOL HCL ER 60 MG PO CP24: 60.0000 mg | ORAL_CAPSULE | Freq: Every day | ORAL | Status: DC

## 2012-07-17 NOTE — Patient Instructions (Signed)
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 These 3 things are the foundation of rehabilitating your brain. Staying off all abusable substances including nicotine, caffeine, and refined sugar and avoiding further head injuries are the other important elements in helping you keep your brain working the best it can for you. If memory is a problem then INSTEAD of the fish oil mentioned above, try using Brain Power Basics from [employer].  You can order online or by phone [phone]. It costs $99 for the first month, and $80 monthly thereafter, but that investment in your brain and the recovery of your brain proper functioning would seem worth it.  Try higher dose of sertraline 3 a day all at would be fine.  If it upsets your stomach you can spread it out.   Take care of yourself.  No one else is standing up to do the job and only you know what you need.   GET SERIOUS about taking care of yourself.  Do the next right thing and that often means doing something to care for yourself along the lines of are you hungry, are you angry, are you lonely, are you tired, are you scared?  [employer] is what that stands for.  Call if problems or concerns.

## 2012-07-17 NOTE — Progress Notes (Signed)
Psychiatric Assessment Adult [id number]  Patient Identification:  Theresa Jackson Date of Evaluation:  07/17/2012 Start Time: 8:35 AM End Time: 8:50 AM  Chief Complaint: Chief Complaint  Patient presents with  . ADHD  . Anxiety  . Follow-up  . Medication Refill   Subjective: "This is better, but when I get really anxious my OCD gets really bad" Depression 5/10 and Anxiety 8/10, where 0 is none and 10 is the worst.  OCD is 9/10.  The patient returns for follow-up appointment.  Pt reports that she is compliant with the psychotropic medications with fair benefit and no noticeable side effects.  She wants something to better help the anxiety and the OCD.  Discussed her going beyond 250 mg with the Zoloft.  Also discussed her increasing the Inderal or adding Vistaril for her anxiety.  She chose to add more sertraline.  She is agreeable with trying a higher dose on Inderal.  She was concerned that the Vistaril would incerase her appetite.  Spoke of the high fat low carb diet for that.   History of Chief Complaint:   In 3rd grade was referred for eval for ADD and poor concentration.  In the course fo treatment has been on Ritalin (made a zombie, no appetite, in own little world), Adderall (lost appetite), Concerta (may have made more paranoid), Daytrana (rash from this made her paranoid), Focalin, Vyvanse, Zoloft (not help too much with depression), BuSpar (since 10th grade moderate benefits), Strattera.  She did note feeling great on her first dose of Xanax, but does not like the starring off into space feeling with alcohol.  She noted relief from her paranoia when she went to a [employer].   Scratches her head to the point that her hair falls out and some areas are where hair does not grow any more.  Perhaps they have gotten better.   Anxiety Symptoms include decreased concentration, dizziness, nervous/anxious behavior and suicidal ideas. Patient reports no confusion.     Review of Systems   Constitutional: Negative.   Eyes: Positive for itching.  Respiratory: Negative.   Cardiovascular: Negative.   Gastrointestinal: Positive for diarrhea and constipation.  Endocrine: Positive for polyphagia. Negative for cold intolerance, heat intolerance, polydipsia and polyuria.  Genitourinary: Positive for vaginal discharge.  Musculoskeletal: Negative.   Neurological: Positive for dizziness, syncope, light-headedness and headaches. Negative for tremors, seizures, facial asymmetry, speech difficulty, weakness and numbness.  Psychiatric/Behavioral: Positive for suicidal ideas, sleep disturbance, dysphoric mood and decreased concentration. Negative for hallucinations, behavioral problems, confusion, self-injury and agitation. The patient is nervous/anxious and is hyperactive.    Physical Exam  Depressive Symptoms: anhedonia, feelings of worthlessness/guilt, hopelessness, recurrent thoughts of death, suicidal thoughts without plan, hypersomnia,  (Hypo) Manic Symptoms:   Elevated Mood:  Yes Irritable Mood:  No Grandiosity:  No Distractibility:  Yes Labiality of Mood:  Yes Delusions:  Yes Hallucinations:  No Impulsivity:  Yes Sexually Inappropriate Behavior:  No Financial Extravagance:  No Flight of Ideas:  Yes  Anxiety Symptoms: Excessive Worry:  Yes Panic Symptoms:  Yes Agoraphobia:  No Obsessive Compulsive: Yes  Symptoms: Checking, Counting, songs stuck in head on cyclic basis Specific Phobias:  No Social Anxiety:  No  Psychotic Symptoms:  Hallucinations: No  Delusions:  Yes Paranoia:  Yes   Ideas of Reference:  Yes  PTSD Symptoms: Ever had a traumatic exposure:  No Had a traumatic exposure in the last month:  No  Traumatic Brain Injury: No   Past Psychiatric History: Diagnosis:  ADHD, Central Auditory Processing Difficulty, OCD  Hospitalizations: none  Outpatient Care: Dr. Verdie Mosher  Substance Abuse Care: none  Self-Mutilation: scratch head by compulsion   Suicidal Attempts: none  Violent Behaviors: none   Past Medical History:   Past Medical History  Diagnosis Date  . ADHD (attention deficit hyperactivity disorder)   . Obsessive-compulsive disorder   . Seizures    History of Loss of Consciousness:  No Seizure History:  Yes on Depakote from age 66 to 75 Cardiac History:  No Allergies:  No Known Allergies Current Medications:  Current Outpatient Prescriptions  Medication Sig Dispense Refill  . busPIRone (BUSPAR) 15 MG tablet Take 1 tablet (15 mg total) by mouth 4 (four) times daily.  120 tablet  1  . propranolol (INDERAL) 10 MG tablet Take 1 tablet (10 mg total) by mouth 3 (three) times daily.  90 tablet  1  . sertraline (ZOLOFT) 100 MG tablet Take 2.5 tablets (250 mg total) by mouth daily.  225 tablet  0   No current facility-administered medications for this visit.    Previous Psychotropic Medications:  Medication Dose   see HPI                       Substance Abuse History in the last 12 months: Substance Age of 1st Use Last Use Amount Specific Type  Nicotine  19  19  once    Alcohol  21  2 weeks ago      Cannabis  none        Opiates  none        Cocaine  none        Methamphetamines  none        LSD  none        Ecstasy  none         Benzodiazepines  [age]      Caffeine  childhood  this week      Inhalants  none        Others:       sugar  childhood  this AM                  Medical Consequences of Substance Abuse: none  Legal Consequences of Substance Abuse: none  Family Consequences of Substance Abuse: none  Social History: Current Place of Residence: [address] [place] [address] Place of Birth: [place], [place] Family Members: husband Marital Status:  Married Children: 0  Sons: 0  Daughters: 0 Relationships: spouse Education:  Corporate treasurer Problems/Performance: learning disability Religious Beliefs/Practices: Tax adviser History of Abuse: none Nutritional therapist History:  None. Legal History: none Hobbies/Interests:   Family History:   Family History  Problem Relation Age of Onset  . Depression Maternal Grandfather   . Anxiety disorder Mother   . ADD / ADHD Brother   . ADD / ADHD Maternal Aunt   . Alcohol abuse Maternal Grandmother   . Bipolar disorder Maternal Grandmother   . Dementia Paternal Grandmother   . Hip fracture Paternal Grandmother   . Seizures Cousin   . Drug abuse Neg Hx   . OCD Neg Hx   . Paranoid behavior Neg Hx   . Schizophrenia Neg Hx   . Sexual abuse Neg Hx   . Physical abuse Neg Hx   . ADD / ADHD Maternal Aunt     Mental Status Examination/Evaluation: Objective:  Appearance: Casual  Eye Contact::  Good  Speech:  Clear and Coherent  Volume:  Normal  Mood:  Slight anxiety  Affect:  Congruent  Thought Process:  Coherent, Intact and Logical  Orientation:  Full (Time, Place, and Person)  Thought Content:  very mild level of suspicion  Suicidal Thoughts:  No  Homicidal Thoughts:  No  Judgement:  Good  Insight:  Good  Psychomotor Activity:  Normal  Akathisia:  No  Handed:  Right  AIMS (if indicated):    Assets:  Communication Skills Desire for Improvement    Laboratory/X-Ray Psychological Evaluation(s)   none  none   Assessment:    AXIS I ADHD, inattentive type, Bipolar, mixed, Generalized Anxiety Disorder and Obsessive Compulsive Disorder  AXIS II Deferred  AXIS III Past Medical History  Diagnosis Date  . ADHD (attention deficit hyperactivity disorder)   . Obsessive-compulsive disorder   . Seizures      AXIS IV other psychosocial or environmental problems  AXIS V 51-60 moderate symptoms   Treatment Plan/Recommendations:  Laboratory:  Vitamin D level with labs  Psychotherapy: supportive, CBT, and probably couples counesling  Medications: Inderal, Prozac, continue Buspar at higher dose  Routine PRN Medications:  No  Consultations: none  Safety Concerns:  none  Other:      Plan/Discussion: I took her vitals.  I reviewed CC, tobacco/med/surg Hx, meds effects/ side effects, problem list, therapies and responses as well as current situation/symptoms discussed options. Increase  Zoloft as it works for her and Inderal for better control of anxiety.  Offered Vistaril, but she was reluctant to add that. See orders and pt instructions for more details.  Medical Decision Making Problem Points:  Established problem, stable/improving (1), Established problem, worsening (2), Review of last therapy session (1) and Review of psycho-social stressors (1) Data Points:  Review or order clinical lab tests (1) Review of medication regiment & side effects (2) Review of new medications or change in dosage (2)  I certify that outpatient services furnished can reasonably be expected to improve the patient's condition.   Orson Aloe, MD, [facility]

## 2012-07-19 ENCOUNTER — Ambulatory Visit (HOSPITAL_COMMUNITY): Payer: Self-pay | Admitting: Psychiatry

## 2012-07-25 ENCOUNTER — Ambulatory Visit (INDEPENDENT_AMBULATORY_CARE_PROVIDER_SITE_OTHER): Payer: BC Managed Care – PPO | Admitting: Psychiatry

## 2012-07-25 DIAGNOSIS — F329 Major depressive disorder, single episode, unspecified: Secondary | ICD-10-CM

## 2012-07-25 DIAGNOSIS — F909 Attention-deficit hyperactivity disorder, unspecified type: Secondary | ICD-10-CM

## 2012-07-25 DIAGNOSIS — F429 Obsessive-compulsive disorder, unspecified: Secondary | ICD-10-CM
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
## 2012-07-25 NOTE — Patient Instructions (Signed)
Discussed orally 

## 2012-08-08 ENCOUNTER — Ambulatory Visit (HOSPITAL_COMMUNITY): Payer: Self-pay | Admitting: Psychiatry

## 2012-08-09 ENCOUNTER — Ambulatory Visit (INDEPENDENT_AMBULATORY_CARE_PROVIDER_SITE_OTHER): Payer: BC Managed Care – PPO | Admitting: Psychiatry

## 2012-08-09 DIAGNOSIS — F429 Obsessive-compulsive disorder, unspecified: Secondary | ICD-10-CM

## 2012-08-09 DIAGNOSIS — F909 Attention-deficit hyperactivity disorder, unspecified type: Secondary | ICD-10-CM

## 2012-08-09 DIAGNOSIS — F329 Major depressive disorder, single episode, unspecified: Secondary | ICD-10-CM
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
## 2012-08-09 NOTE — Patient Instructions (Addendum)
Discussed orally 

## 2012-08-16 ENCOUNTER — Other Ambulatory Visit (HOSPITAL_COMMUNITY): Payer: Self-pay | Admitting: Psychiatry

## 2012-09-07 ENCOUNTER — Telehealth (HOSPITAL_COMMUNITY): Payer: Self-pay | Admitting: Psychiatry

## 2012-09-07 DIAGNOSIS — F411 Generalized anxiety disorder: Secondary | ICD-10-CM

## 2012-09-07 DIAGNOSIS — F429 Obsessive-compulsive disorder, unspecified: Secondary | ICD-10-CM

## 2012-09-07 DIAGNOSIS — Z820 Family history of epilepsy and other diseases of the nervous system: Secondary | ICD-10-CM

## 2012-09-07 MED ORDER — BUSPIRONE HCL 15 MG PO TABS: 15.0000 mg | ORAL_TABLET | Freq: Four times a day (QID) | ORAL | Status: DC

## 2012-09-07 MED ORDER — SERTRALINE HCL 100 MG PO TABS: 300.0000 mg | ORAL_TABLET | Freq: Every day | ORAL | Status: DC

## 2012-09-07 MED ORDER — PROPRANOLOL HCL ER 60 MG PO CP24: 60.0000 mg | ORAL_CAPSULE | Freq: Every day | ORAL | Status: DC

## 2012-09-07 NOTE — Telephone Encounter (Signed)
Called patient. 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  The patient reports she is doing better on this medication.  Warned patient about symptoms of serotonin syndrome and to go to the ER with the same.  Filled sertraline, buspirone, and propranolol. She is no longer taking fluoxetine.

## 2012-09-11 ENCOUNTER — Encounter: Payer: Self-pay | Admitting: Family Medicine

## 2012-09-11 ENCOUNTER — Ambulatory Visit (INDEPENDENT_AMBULATORY_CARE_PROVIDER_SITE_OTHER): Payer: BC Managed Care – PPO | Admitting: Family Medicine

## 2012-09-11 VITALS — BP 110/78 | HR 89 | Temp 98.2°F | Wt 129.8 lb

## 2012-09-11 DIAGNOSIS — J029 Acute pharyngitis, unspecified: Secondary | ICD-10-CM | POA: Insufficient documentation

## 2012-09-11 DIAGNOSIS — H6692 Otitis media, unspecified, left ear: Secondary | ICD-10-CM

## 2012-09-11 DIAGNOSIS — H6690 Otitis media, unspecified, unspecified ear: Secondary | ICD-10-CM | POA: Insufficient documentation

## 2012-09-11 LAB — POCT RAPID STREP A (OFFICE): Rapid Strep A Screen: NEGATIVE

## 2012-09-11 MED ORDER — AMOXICILLIN 250 MG/5ML PO SUSR: 500.0000 mg | Freq: Three times a day (TID) | ORAL | Status: DC

## 2012-09-11 MED ORDER — FLUCONAZOLE 150 MG PO TABS: 150.0000 mg | ORAL_TABLET | Freq: Once | ORAL | Status: DC

## 2012-09-11 NOTE — Progress Notes (Signed)
Patient ID: Theresa Jackson, female   DOB: [birth year], 22 y.o.   MRN: [id number] SUBJECTIVE:  Chief Complaint  Patient presents with  . Acute Visit    sore throat earaache cough   HPI Works in Audiological scientist. Left ear feels stuffed up and sore. Has a sorethroat. o cough, no SOB  PMH/PSH: reviewed/updated in Epic  SH/FH: reviewed/updated in Epic  Allergies: reviewed/updated in Epic  Medications: reviewed/updated in Epic  Immunizations: reviewed/updated in Epic  ROS: As above in the HPI. All other systems are stable or negative.  OBJECTIVE: APPEARANCE:  Patient in no acute distress.The patient appeared well nourished and normally developed. Acyanotic. Waist: VITAL SIGNS:BP 110/78  Pulse 89  Temp(Src) 98.2 F (36.8 C) (Oral)  Wt 129 lb 12.8 oz (58.877 kg)  LMP 09/11/2012  WF  SKIN: warm and  Dry without overt rashes, tattoos and scars  HEAD and Neck: without JVD, Head and scalp: normal Eyes:No scleral icterus. Fundi normal, eye movements normal. Ears: Auricle normal, canal normal, Tympanic membranes normal, insufflation normal. Nose: normal Throat: normal Neck & thyroid: normal  CHEST & LUNGS: Chest wall: normal Lungs: Clear  CVS: Reveals the PMI to be normally located. Regular rhythm, First and Second Heart sounds are normal,  absence of murmurs, rubs or gallops. Peripheral vasculature: Radial pulses: normal Dorsal pedis pulses: normal Posterior pulses: normal  ABDOMEN:  Appearance: normal Benign, no organomegaly, no masses, no Abdominal Aortic enlargement. No Guarding , no rebound. No Bruits. Bowel sounds: normal  RECTAL: N/A GU: N/A  EXTREMETIES: nonedematous. Both Femoral and Pedal pulses are normal.  MUSCULOSKELETAL:  Spine: normal Joints: intact  NEUROLOGIC: oriented to time,place and person; nonfocal. Strength is normal Sensory is normal Reflexes are normal Cranial Nerves are normal.  ASSESSMENT: Sore throat - Plan: POCT rapid strep  A, amoxicillin (AMOXIL) 250 MG/5ML suspension  Otitis media, left - Plan: amoxicillin (AMOXIL) 250 MG/5ML suspension  PLAN: Orders Placed This Encounter  Procedures  . POCT rapid strep A   Results for orders placed in visit on 09/11/12  POCT RAPID STREP A (OFFICE)      Result Value Range   Rapid Strep A Screen Negative  Negative   Meds ordered this encounter  Medications  . amoxicillin (AMOXIL) 250 MG/5ML suspension    Sig: Take 10 mLs (500 mg total) by mouth 3 (three) times daily.    Dispense:  300 mL    Refill:  0  . fluconazole (DIFLUCAN) 150 MG tablet    Sig: Take 1 tablet (150 mg total) by mouth once.    Dispense:  1 tablet    Refill:  1   Return if symptoms worsen or fail to improve.  Eamon Tantillo P. Modesto Charon, M.D.

## 2012-09-27 ENCOUNTER — Telehealth: Payer: Self-pay | Admitting: Nurse Practitioner

## 2012-09-27 ENCOUNTER — Ambulatory Visit (INDEPENDENT_AMBULATORY_CARE_PROVIDER_SITE_OTHER): Payer: BC Managed Care – PPO | Admitting: General Practice

## 2012-09-27 ENCOUNTER — Encounter: Payer: Self-pay | Admitting: General Practice

## 2012-09-27 VITALS — BP 107/71 | HR 93 | Temp 98.6°F | Ht 62.5 in | Wt 129.5 lb

## 2012-09-27 DIAGNOSIS — J020 Streptococcal pharyngitis: Secondary | ICD-10-CM

## 2012-09-27 DIAGNOSIS — J322 Chronic ethmoidal sinusitis: Secondary | ICD-10-CM

## 2012-09-27 MED ORDER — AZITHROMYCIN 250 MG PO TABS: ORAL_TABLET | ORAL | Status: DC

## 2012-09-27 NOTE — Patient Instructions (Signed)

## 2012-09-27 NOTE — Progress Notes (Signed)
  Subjective:    Patient ID: Theresa Jackson, female    DOB: Aug 31, 1990, 22 y.o.   MRN: [id number]  HPI Presents today for sore throat. The onset of sore throat was 3 days ago. She reports recent ear infection. She reports taking amoxicillin as directed. She reports have the same symptoms as before. She reports taking mucinex with minimal relief.     Review of Systems  Constitutional: Negative for fever and chills.  HENT: Positive for congestion, sore throat, sneezing and sinus pressure. Negative for neck pain and neck stiffness.   Respiratory: Positive for cough. Negative for chest tightness and shortness of breath.   Cardiovascular: Negative for chest pain and palpitations.  Genitourinary: Negative for dysuria, urgency, frequency and difficulty urinating.  Neurological: Negative for dizziness and numbness.       Objective:   Physical Exam  Constitutional: She is oriented to person, place, and time. She appears well-developed and well-nourished.  HENT:  Right Ear: External ear normal.  Left Ear: External ear normal.  Nose: Right sinus exhibits maxillary sinus tenderness. Left sinus exhibits maxillary sinus tenderness.  Mouth/Throat: Posterior oropharyngeal erythema present.  Eyes: EOM are normal.  Neck: Normal range of motion. Neck supple.  Cardiovascular: Normal rate, regular rhythm and normal heart sounds.   Pulmonary/Chest: Effort normal and breath sounds normal. No respiratory distress. She exhibits no tenderness.  Neurological: She is alert and oriented to person, place, and time.  Skin: Skin is warm and dry.  Psychiatric: She has a normal mood and affect.          Assessment & Plan:  1. Streptococcal sore throat - Rapid Strep A  2. Ethmoid sinusitis - azithromycin (ZITHROMAX) 250 MG tablet; Take as directed  Dispense: 6 tablet; Refill: 0 -Increase fluid intake Motrin or tylenol OTC OTC decongestant Throat lozenges if help New toothbrush in 3 days Proper  handwashing RTO if symptoms worsen or unresolved Patient verbalized understanding Coralie Keens, FNP-C

## 2012-09-27 NOTE — Telephone Encounter (Signed)
Appt given for today 

## 2012-10-05 ENCOUNTER — Ambulatory Visit (HOSPITAL_COMMUNITY): Payer: Self-pay | Admitting: Psychiatry

## 2012-10-12 ENCOUNTER — Ambulatory Visit (HOSPITAL_COMMUNITY): Payer: Self-pay | Admitting: Psychiatry

## 2012-10-12 ENCOUNTER — Ambulatory Visit (INDEPENDENT_AMBULATORY_CARE_PROVIDER_SITE_OTHER): Payer: BC Managed Care – PPO | Admitting: Psychiatry

## 2012-10-12 ENCOUNTER — Encounter (HOSPITAL_COMMUNITY): Payer: Self-pay | Admitting: Psychiatry

## 2012-10-12 VITALS — BP 109/78 | HR 76 | Wt 130.0 lb

## 2012-10-12 DIAGNOSIS — Z820 Family history of epilepsy and other diseases of the nervous system: Secondary | ICD-10-CM

## 2012-10-12 DIAGNOSIS — F411 Generalized anxiety disorder: Secondary | ICD-10-CM

## 2012-10-12 DIAGNOSIS — F429 Obsessive-compulsive disorder, unspecified: Secondary | ICD-10-CM

## 2012-10-12 MED ORDER — PROPRANOLOL HCL ER 60 MG PO CP24: 60.0000 mg | ORAL_CAPSULE | Freq: Every day | ORAL | Status: DC

## 2012-10-12 MED ORDER — BUSPIRONE HCL 15 MG PO TABS: 15.0000 mg | ORAL_TABLET | Freq: Four times a day (QID) | ORAL | Status: DC

## 2012-10-12 NOTE — Progress Notes (Signed)
Theresa Jackson  Subjective I am doing better on medication.  History of present illness. Patient is 22 year old Caucasian married employed female who came for her followup appointment.  She's taking Zoloft 300 mg, BuSpar 15 mg 4 times a day and propranolol 60 mg daily.  Patient denies any side effects including any serotonin syndrome.  She is calm.  She sleeping better.  She is having her job very well.  She also has ADD however she does not have any symptoms of attention focus and concentration problems.  She does not feel that she requires medication for ADD.  Her OCD is much better with the medication.  She sleeping better.  She's not drinking or using any illegal substance.  Patient has not seen therapist for more than 2 months.  Patient admitted, she has busy schedule however she will like to followup with a therapist in the future.  Medical history Patient recently has ear infection.  She's taking antibiotic.  She still has some symptoms but overall getting better.  Filed Vitals:   10/12/12 0944  BP: 109/78  Pulse: 76   Mental Status Examination Patient is well groomed well dressed female who appears to be in his stated age.  She is calm cooperative.  Her speech is fluent and coherent.  Her thought processes are logical linear and goal-directed.  There were no tremors or shakes.  She denies any active or passive suicidal thoughts or homicidal thoughts.  She denies any auditory or visual hallucination.  There were no paranoia or delusions present at this time.  Her psychomotor activity is normal.  She's alert and oriented x3.  Her insight judgment and impulse control is okay.  Current Medication Current outpatient prescriptions:busPIRone (BUSPAR) 15 MG tablet, Take 1 tablet (15 mg total) by mouth 4 (four) times daily., Disp: 120 tablet, Rfl: 1;  propranolol ER (INDERAL LA) 60 MG 24 hr capsule, Take 1 capsule (60 mg total) by mouth daily., Disp: 30 capsule, Rfl: 1;   sertraline (ZOLOFT) 100 MG tablet, Take 3 tablets (300 mg total) by mouth daily., Disp: 270 tablet, Rfl: 0 azithromycin (ZITHROMAX) 250 MG tablet, Take as directed, Disp: 6 tablet, Rfl: 0   Assessment Axis I obsessive-compulsive disorder, ADD by history Axis II deferred Axis III recent infection Axis IV mild Axis V 70-75  Plan At this time patient is fairly stable on her psychotropic medication.  She's taking the high-dose of sertraline along with BuSpar.  her vital signs are okay.  We talked about serotonin syndrome, explain if she exhibited any symptoms and she should call us immediately.  Discuss safety plan.  Recommend to see therapist for coping and social skills.  She has not seen therapist for more than 2 months.  Recommend to call us back if she is any question or concern if she feels worsening of the symptom.  Followup in 2 months.

## 2012-10-16 ENCOUNTER — Ambulatory Visit (HOSPITAL_COMMUNITY): Payer: Self-pay | Admitting: Psychiatry

## 2012-11-13 ENCOUNTER — Ambulatory Visit (INDEPENDENT_AMBULATORY_CARE_PROVIDER_SITE_OTHER): Payer: BC Managed Care – PPO | Admitting: Psychiatry

## 2012-11-13 DIAGNOSIS — F429 Obsessive-compulsive disorder, unspecified: Secondary | ICD-10-CM

## 2012-11-13 NOTE — Patient Instructions (Signed)
Discussed orally 

## 2012-11-13 NOTE — Progress Notes (Signed)
Patient:  Theresa Jackson   DOB: 1990/04/17  MR Number: [id number]  [facility]  [address] [place],  [place], [address]  Start: Monday 11/13/2012 9:10 AM End: Monday 11/13/2012 9:55 AM  Provider/Observer:     Florencia Reasons, MSW, LCSW   Chief Complaint:      Chief Complaint  Patient presents with  . Anxiety   Reason For Service:      The patient is referred for services by Dr Loraine Leriche due to patient experiencing symptoms of anxiety and depression. 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 Symptoms have worsened as she has aged. She states being so "paranoid" about her past that it is interfering with her daily functioning. She reports sending pictures of herself dressed in her underwear to a guy 3 or 4 years ago. She now constantly fears he will share the pictures, she will lose her job, and go to jail. She also reports a guy sent her pictures of himself naked several years ago. She checks her flash drive and her phone constantly to make certain the pictures aren't there. Patient also reports thinking if she doesn't touch her flash drive a certain number of times, something bad is going to happen She checks electronics and appliances in her home several times in a certain order every morning and says this process takes about 25 minutes. 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 She also shares memories from childhood where she reports worrying and pulling her hair so frequently that she had open sores in her head. She also remembers asking her father to check her pocketbook each night before she went to bed. Patient reports being so overwhelmed with anxiety and her OCD tendencies that she becomes depressed and thinks she shouldn't be here. Patient is seen for follow up appointment.  Interventions  Strategy:  Supportive therapy, cognitive behavioral therapy, psychoeducation  Participation Level:   Active  Participation Quality:  Appropriate      Behavioral Observation:  Casual, Alert, and fidgety,   Current Psychosocial Factors: Patient reports two friends died within the past 2 months.  Content of Session:    Reviewing symptoms, processing feelings, reinforcing patient's efforts to improve assertiveness skills, identifying coping statements, reviewing relaxation techniques  Current Status:   The patient reports decreased anxiety, decreased worry and decreased intensity and frequency of obsessions and compulsions   Patient Progress:   Patient reports improvement since last session. She reports less stress in marriage as she and  husband have improved there communication skills. 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 However, patient continues to sometimes become frustrated regarding some of the practices at her daycare center. Therapist works with patient to process her feelings and to identify coping statements as well as review relaxation techniques. Patient expresses appropriate sadness about the recent deaths of 2 friends. Therapist works with patient to process grief and loss issues. Patient reports increased thoughts about her past triggered by a recent comment on face book. Therapist works with patient to identify thought patterns and to externalize OCD.    Target Goals:  Patient's words: "Stop checking everything, stop having weird thoughts in my head, stop letting  people get to me, increase my  self-esteem"  1. Decrease intensity and frequency of obsessions and compulsions: 1:1 psychotherapy one time every 1-4 weeks (supportive, cognitive behavioral therapy)   2. Improve assertiveness skills and ability to set and maintain boundaries: 1:1 psychotherapy one time every 1-4 weeks (supportive, cognitive behavior  therapy)   3. Increase self acceptance: 1:1 psychotherapy one time every 1-4 weeks (supportive, cognitive behavioral therapy)  Last Reviewed:  06/02/2012   Goals Addressed Today:    Goals 1, 2  Impression/Diagnosis:   The patient presents with a long-standing history of symptoms of anxiety that have worsened with age. She currently is experiencing obsessions and compulsions that are interfering with her daily functioning and consuming a significant amount of time. Patient also is experiencing depressed mood related to feeling overwhelmed with the obsessions and compulsions. Patient also shares a history of mood swings and states being super happy and then being super sad. Patient also presents with a history of being diagnosed with ADHD and CAPD.  Diagnoses: Obsessive-compulsive disorder, depressive disorder NOS, rule out bipolar disorder ADHD by history   Diagnosis:  Axis I: OCD (obsessive compulsive disorder)          Axis II: Deferred

## 2012-12-06 ENCOUNTER — Telehealth (HOSPITAL_COMMUNITY): Payer: Self-pay | Admitting: Psychiatry

## 2012-12-07 NOTE — Telephone Encounter (Signed)
Discussed meds over the phone with pt. None of these cause headache. She needs to come in, not seen since 4/14. Also recommended she see primary MD for headache

## 2012-12-14 ENCOUNTER — Ambulatory Visit (HOSPITAL_COMMUNITY): Payer: Self-pay | Admitting: Psychiatry

## 2012-12-14 ENCOUNTER — Ambulatory Visit (INDEPENDENT_AMBULATORY_CARE_PROVIDER_SITE_OTHER): Payer: BC Managed Care – PPO | Admitting: Psychiatry

## 2012-12-14 ENCOUNTER — Encounter (HOSPITAL_COMMUNITY): Payer: Self-pay | Admitting: Psychiatry

## 2012-12-14 VITALS — BP 120/80 | Ht 62.0 in | Wt 131.0 lb

## 2012-12-14 DIAGNOSIS — F411 Generalized anxiety disorder: Secondary | ICD-10-CM

## 2012-12-14 DIAGNOSIS — Z820 Family history of epilepsy and other diseases of the nervous system: Secondary | ICD-10-CM

## 2012-12-14 DIAGNOSIS — F429 Obsessive-compulsive disorder, unspecified: Secondary | ICD-10-CM

## 2012-12-14 MED ORDER — SERTRALINE HCL 100 MG PO TABS: 300.0000 mg | ORAL_TABLET | Freq: Every day | ORAL | Status: DC

## 2012-12-14 MED ORDER — BUSPIRONE HCL 15 MG PO TABS: 15.0000 mg | ORAL_TABLET | Freq: Four times a day (QID) | ORAL | Status: DC

## 2012-12-14 MED ORDER — PROPRANOLOL HCL ER 60 MG PO CP24: 60.0000 mg | ORAL_CAPSULE | Freq: Every day | ORAL | Status: DC

## 2012-12-14 MED ORDER — ALPRAZOLAM 0.5 MG PO TABS: 0.5000 mg | ORAL_TABLET | Freq: Two times a day (BID) | ORAL | Status: DC | PRN

## 2012-12-14 NOTE — Progress Notes (Signed)
Patient ID: Theresa Jackson, female   DOB: [birth year], 22 y.o.   MRN: [id number] [name] 1992  Subjective I am doing better on medication.  History of present illness. Patient is 22 year old Caucasian married employed female who came for her followup appointment. 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  Patient denies any side effects including any serotonin syndrome.  She is calm.  She sleeping better.   Her job has been stressful. 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 She often has headaches and feels stressed and overwhelmed. She would like to have something to take for the stress. She had tried Xanax once before for tooth extraction and it really helped her feel calm She also has ADD however she does not have any symptoms of attention focus and concentration problems.  She does not feel that she requires medication for ADD.  Her OCD is much better with the medication.  She sleeping better.  She's not drinking or using any illegal substance.  Patient has not seen therapist for more than 2 months.  Patient admitted, she has busy schedule however she will like to followup with a therapist in the future.  Ceasar Mons Vitals:   12/14/12 0947  BP: 120/80   Mental Status Examination Patient is well groomed well dressed female who appears to be in his stated age.  She is calm cooperative.  Her speech is fluent and coherent.  Her thought processes are logical linear and goal-directed.  There were no tremors or shakes.  She denies any active or passive suicidal thoughts or homicidal thoughts.  She denies any auditory or visual hallucination.  There were no paranoia or delusions present at this time.  Her psychomotor activity is normal.  She's alert and oriented x3.  Her insight judgment and impulse control is okay.  Current Medication Current  outpatient prescriptions:azithromycin (ZITHROMAX) 250 MG tablet, Take as directed, Disp: 6 tablet, Rfl: 0;  busPIRone (BUSPAR) 15 MG tablet, Take 1 tablet (15 mg total) by mouth 4 (four) times daily., Disp: 360 tablet, Rfl: 1;  propranolol ER (INDERAL LA) 60 MG 24 hr capsule, Take 1 capsule (60 mg total) by mouth daily., Disp: 90 capsule, Rfl: 1 sertraline (ZOLOFT) 100 MG tablet, Take 3 tablets (300 mg total) by mouth daily., Disp: 270 tablet, Rfl: 1;  ALPRAZolam (XANAX) 0.5 MG tablet, Take 1 tablet (0.5 mg total) by mouth 2 (two) times daily as needed for sleep or anxiety., Disp: 30 tablet, Rfl: 1   Assessment Axis I obsessive-compulsive disorder, ADD by history Axis II deferred Axis III recent infection Axis IV mild Axis V 70-75  Plan At this time patient is fairly stable on her psychotropic medication.  She's taking the high-dose of sertraline along with BuSpar. I agreed to add Xanax 0.5 mg twice a day  her vital signs are okay.  We talked about serotonin syndrome, explain if she exhibited any symptoms and she should call us immediately.  Discuss safety plan.  Recommend to see therapist for coping and social skills.  She has not seen therapist for more than 2 months.  Recommend to call us back if she is any question or concern if she feels worsening of the symptom.  Followup in 2 months.  Diannia Ruder MD

## 2013-01-10 ENCOUNTER — Ambulatory Visit (INDEPENDENT_AMBULATORY_CARE_PROVIDER_SITE_OTHER): Payer: BC Managed Care – PPO | Admitting: Psychiatry

## 2013-01-10 DIAGNOSIS — F429 Obsessive-compulsive disorder, unspecified: Secondary | ICD-10-CM

## 2013-01-10 NOTE — Patient Instructions (Signed)
Discussed orally 

## 2013-01-10 NOTE — Progress Notes (Signed)
Patient:  Theresa Jackson   DOB: 01-Apr-1991  MR Number: [id number]  Location: [facility]:  [address] [place] [place],  [place], [address]  Start: Wednesday 01/10/2013 9:15 AM End: Wednesday 01/10/2013 10:00  AM  Provider/Observer:     Florencia Reasons, MSW, LCSW   Chief Complaint:      Chief Complaint  Patient presents with  . Anxiety   Reason For Service:      The patient is referred for services by Dr Loraine Leriche due to patient experiencing symptoms of anxiety and depression. 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 Symptoms have worsened as she has aged. She states being so "paranoid" about her past that it is interfering with her daily functioning. She reports sending pictures of herself dressed in her underwear to a guy 3 or 4 years ago. She now constantly fears he will share the pictures, she will lose her job, and go to jail. She also reports a guy sent her pictures of himself naked several years ago. She checks her flash drive and her phone constantly to make certain the pictures aren't there. Patient also reports thinking if she doesn't touch her flash drive a certain number of times, something bad is going to happen She checks electronics and appliances in her home several times in a certain order every morning and says this process takes about 25 minutes. 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 She also shares memories from childhood where she reports worrying and pulling her hair so frequently that she had open sores in her head. She also remembers asking her father to check her pocketbook each night before she went to bed. Patient reports being so overwhelmed with anxiety and her OCD tendencies that she becomes depressed and thinks she shouldn't be here. Patient is seen for follow up appointment.  Interventions  Strategy:  Supportive therapy, cognitive behavioral therapy, psychoeducation  Participation Level:   Active  Participation Quality:  Appropriate      Behavioral Observation:  Casual, Alert, and fidgety,   Current Psychosocial Factors: Patient reports continued job stress.  Content of Session:    Reviewing symptoms, processing feelings, identifying ways to improve assertiveness skills, exploring resources, reviewing relaxation techniques  Current Status:   The patient reports continued anxiety and worry regarding her job.  Patient Progress:   Patient reports enjoying celebrating her one year wedding anniversary on a [address] in September, 2014. She reports less stress regarding husband as she has been able to ignore him when he is in a negative mood. She reports stress related to her job as she does not have support from her daycare Journalist, newspaper regarding having needed items for the classroom. She also expresses frustration regarding some of the children's behaviors especially excessive crying. Therapist works with patient to identify ways to be assertive and making a request for needed equipment. Therapist also works with patient to identify resources to increase knowledge of ways to manage challenging behaviors in young children. Therapist and patient also review relaxation techniques.    Target Goals:  Patient's words: "Stop checking everything, stop having weird thoughts in my head, stop letting  people get to me, increase my  self-esteem"      1. Decrease intensity and frequency of obsessions and compulsions: 1:1 psychotherapy one time every 1-4 weeks (supportive, cognitive  behavioral therapy)   2. Improve assertiveness skills and ability to set and maintain boundaries: 1:1 psychotherapy one time every 1-4 weeks (supportive, cognitive behavior therapy)   3. Increase self acceptance: 1:1 psychotherapy one time every 1-4 weeks (supportive, cognitive behavioral therapy)  Last  Reviewed:  06/02/2012   Goals Addressed Today:    Goal 2  Impression/Diagnosis:   The patient presents with a long-standing history of symptoms of anxiety that have worsened with age. She currently is experiencing obsessions and compulsions that are interfering with her daily functioning and consuming a significant amount of time. Patient also is experiencing depressed mood related to feeling overwhelmed with the obsessions and compulsions. Patient also shares a history of mood swings and states being super happy and then being super sad. Patient also presents with a history of being diagnosed with ADHD and CAPD.  Diagnoses: Obsessive-compulsive disorder, depressive disorder NOS, rule out bipolar disorder ADHD by history   Diagnosis:  Axis I: OCD (obsessive compulsive disorder)          Axis II: Deferred

## 2013-02-13 ENCOUNTER — Ambulatory Visit (INDEPENDENT_AMBULATORY_CARE_PROVIDER_SITE_OTHER): Payer: BC Managed Care – PPO | Admitting: Psychiatry

## 2013-02-13 ENCOUNTER — Encounter (HOSPITAL_COMMUNITY): Payer: Self-pay | Admitting: Psychiatry

## 2013-02-13 VITALS — BP 100/80 | Ht 62.0 in | Wt 132.0 lb

## 2013-02-13 DIAGNOSIS — F411 Generalized anxiety disorder: Secondary | ICD-10-CM

## 2013-02-13 DIAGNOSIS — Z8659 Personal history of other mental and behavioral disorders: Secondary | ICD-10-CM

## 2013-02-13 DIAGNOSIS — F429 Obsessive-compulsive disorder, unspecified: Secondary | ICD-10-CM

## 2013-02-13 DIAGNOSIS — Z820 Family history of epilepsy and other diseases of the nervous system: Secondary | ICD-10-CM

## 2013-02-13 MED ORDER — BUSPIRONE HCL 15 MG PO TABS: 15.0000 mg | ORAL_TABLET | Freq: Four times a day (QID) | ORAL | Status: DC

## 2013-02-13 MED ORDER — PROPRANOLOL HCL ER 60 MG PO CP24: 60.0000 mg | ORAL_CAPSULE | Freq: Every day | ORAL | Status: DC

## 2013-02-13 MED ORDER — SERTRALINE HCL 100 MG PO TABS: 300.0000 mg | ORAL_TABLET | Freq: Every day | ORAL | Status: DC

## 2013-02-13 NOTE — Progress Notes (Signed)
Patient ID: BELISSA KOOY, female   DOB: 10-13-1990, 22 y.o.   MRN: [id number] Patient ID: BAILEIGH MODISETTE, female   DOB: July 15, 1990, 22 y.o.   MRN: [id number] Makeba Delcastillo 1992  Subjective I am doing better on medication.  History of present illness. Patient is 22 year old Caucasian married employed female who came for her followup appointment. 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  Patient denies any side effects including any serotonin syndrome.  She is calm.  She sleeping better.   Her job has been stressful. 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 She often has headaches and feels stressed and overwhelmed. She would like to have something to take for the stress. She had tried Xanax once before for tooth extraction and it really helped her feel calm She also has ADD however she does not have any symptoms of attention focus and concentration problems.  She does not feel that she requires medication for ADD.  Her OCD is much better with the medication.  She sleeping better.  She's not drinking or using any illegal substance.  Patient has not seen therapist for more than 2 months.  Patient admitted, she has busy schedule however she will like to followup with a therapist in the future.  The patient returns after 22 months. She's doing fairly well. She complains of constant headaches and I suggested she try to bring down the dose of her Zoloft to 200 mg per day. She has been doing this most of the time. She also needs to get her eyes checked. 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 She's not taking Xanax because it did not help  .  Filed Vitals:   02/13/13 0959  BP: 100/80   Mental Status Examination Patient is well groomed well dressed female who appears to be in her  stated age.  She is calm cooperative.  Her speech is fluent and coherent.  Her thought processes are logical linear and goal-directed.  There were no tremors or shakes.  She denies any active or passive suicidal thoughts or homicidal thoughts.  She denies any auditory or visual hallucination.  There were no paranoia or delusions present at this time.  Her psychomotor activity is normal.  She's alert and oriented x3.  Her insight judgment and impulse control is okay.  Current Medication Current outpatient prescriptions:busPIRone (BUSPAR) 15 MG tablet, Take 1 tablet (15 mg total) by mouth 4 (four) times daily., Disp: 360 tablet, Rfl: 2;  propranolol ER (INDERAL LA) 60 MG 24 hr capsule, Take 1 capsule (60 mg total) by mouth daily., Disp: 90 capsule, Rfl: 2;  sertraline (ZOLOFT) 100 MG tablet, Take 3 tablets (300 mg total) by mouth daily., Disp: 270 tablet, Rfl: 2   Assessment Axis I obsessive-compulsive disorder, ADD by history Axis II deferred Axis III recent infection Axis IV mild Axis V 70-75  Plan At this time patient is fairly stable on her psychotropic medication.  She's taking the high-dose of sertraline along with BuSpar. I agreed to add Xanax 0.5 mg twice a day  her vital signs are okay.  We talked about serotonin syndrome, explain if she exhibited any symptoms and she should call us immediately.  Discuss safety plan.  Recommend to see therapist for coping  and social skills.    Recommend to call us back if she is any question or concern if she feels worsening of the symptom.  Followup in 3 months.  Diannia Ruder MD

## 2013-02-14 ENCOUNTER — Other Ambulatory Visit (HOSPITAL_COMMUNITY): Payer: Self-pay | Admitting: Psychiatry

## 2013-02-14 DIAGNOSIS — Z820 Family history of epilepsy and other diseases of the nervous system: Secondary | ICD-10-CM

## 2013-02-14 DIAGNOSIS — F429 Obsessive-compulsive disorder, unspecified: Secondary | ICD-10-CM

## 2013-02-14 DIAGNOSIS — F411 Generalized anxiety disorder: Secondary | ICD-10-CM

## 2013-02-14 MED ORDER — PROPRANOLOL HCL ER 60 MG PO CP24: 60.0000 mg | ORAL_CAPSULE | Freq: Every day | ORAL | Status: DC

## 2013-02-14 MED ORDER — BUSPIRONE HCL 15 MG PO TABS: 15.0000 mg | ORAL_TABLET | Freq: Four times a day (QID) | ORAL | Status: DC

## 2013-02-14 MED ORDER — SERTRALINE HCL 100 MG PO TABS: 300.0000 mg | ORAL_TABLET | Freq: Every day | ORAL | Status: DC

## 2013-03-05 ENCOUNTER — Telehealth (HOSPITAL_COMMUNITY): Payer: Self-pay

## 2013-03-05 ENCOUNTER — Other Ambulatory Visit (HOSPITAL_COMMUNITY): Payer: Self-pay | Admitting: Psychiatry

## 2013-03-05 DIAGNOSIS — F411 Generalized anxiety disorder: Secondary | ICD-10-CM

## 2013-03-05 MED ORDER — BUSPIRONE HCL 15 MG PO TABS: 15.0000 mg | ORAL_TABLET | Freq: Four times a day (QID) | ORAL | Status: DC

## 2013-03-07 ENCOUNTER — Ambulatory Visit (HOSPITAL_COMMUNITY): Payer: Self-pay | Admitting: Psychiatry

## 2013-04-05 NOTE — L&D Delivery Note (Signed)
Delivery Note  SVD viable female Apgars 9,9 over 2nd degree ML lac.  Placenta delivered spontaneously intact with 3VC. Repair with 2-0 Chromic with good support and hemostasis noted and R/V exam confirms.  PH art was sent.  [place] cord blood was not done Pts BPs 140s/80s with no cns sxs. Will not start mag PP unless changes.  Mother and baby were doing well.  EBL 300cc  Candice Camp, MD

## 2013-04-12 ENCOUNTER — Ambulatory Visit (INDEPENDENT_AMBULATORY_CARE_PROVIDER_SITE_OTHER): Payer: BC Managed Care – PPO | Admitting: Psychiatry

## 2013-04-12 DIAGNOSIS — F429 Obsessive-compulsive disorder, unspecified: Secondary | ICD-10-CM

## 2013-04-12 NOTE — Patient Instructions (Signed)
Discussed orally 

## 2013-04-12 NOTE — Progress Notes (Signed)
Patient:  Theresa BooksRebecca S Sarria   DOB: 10/23/1990  MR Number: [id number]  [facility]:  [address] [place],  [place], [id number]  Start: Thursday 04/12/2013 9:20 AM End: Thursday 04/12/2013 9:50 AM  Provider/Observer:     Florencia ReasonsPeggy Bynum, MSW, LCSW   Chief Complaint:      Chief Complaint  Patient presents with  . Stress  . Anxiety   Reason For Service:      The patient is referred for services by Dr Loraine Lerichehomas Kuhn due to patient experiencing symptoms of anxiety and depression. 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 Kem KaysKuhn in the 6th grade. Symptoms have worsened as she has aged. She states being so "paranoid" about her past that it is interfering with her daily functioning. She reports sending pictures of herself dressed in her underwear to a guy 3 or 4 years ago. She now constantly fears he will share the pictures, she will lose her job, and go to jail. She also reports a guy sent her pictures of himself naked several years ago. She checks her flash drive and her phone constantly to make certain the pictures aren't there. Patient also reports thinking if she doesn't touch her flash drive a certain number of times, something bad is going to happen She checks electronics and appliances in her home several times in a certain order every morning and says this process takes about 25 minutes. 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 She also shares memories from childhood where she reports worrying and pulling her hair so frequently that she had open sores in her head. She also remembers asking her father to check her pocketbook each night before she went to bed. Patient reports being so overwhelmed with anxiety and her OCD tendencies that she becomes depressed and thinks she shouldn't be here. Patient is seen for follow up appointment.  Interventions  Strategy:  Supportive therapy, cognitive behavioral therapy, psychoeducation  Participation Level:   Active  Participation Quality:  Appropriate      Behavioral Observation:  Casual, Alert, and fidgety,   Current Psychosocial Factors: Patient reports continued job stress.  Content of Session:    Reviewing symptoms, processing feelings, reinforcing patient's efforts to improve assertiveness skills,  reviewing relaxation and coping techniques  Current Status:   The patient reports decreased anxiety but continued stress regarding job. She also reports continued obsessions and compulsions but decreased intensity and frequency.  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 However, she has decided to leave her job within the next year as her husband's income should increase in urinary future. Patient is pleased with her increased efforts to set and maintain boundaries with her coworkers and cites several examples in session today. She also has determined ways to take breaks during the day. Therapist reinforces patient's efforts. Patient also reports increased self acceptance. She continues to have obsessions and compulsions but reports reduced intensity and frequency. Therapist works with patient to review relaxation and coping techniques.    Target Goals:  Patient's words: "Stop checking everything, stop having weird thoughts in my head, stop letting  people get to me, increase my  self-esteem"      1. Decrease intensity and frequency of obsessions and compulsions: 1:1 psychotherapy one time  every 1-4 weeks (supportive, cognitive behavioral therapy)   2. Improve assertiveness skills and ability to set and maintain boundaries: 1:1 psychotherapy one time every 1-4 weeks (supportive, cognitive behavior therapy)   3. Increase self acceptance: 1:1 psychotherapy one time every 1-4 weeks (supportive,  cognitive behavioral therapy)  Last Reviewed:  06/02/2012   Goals Addressed Today:    Goal 1, 2, and 3  Impression/Diagnosis:   The patient presents with a long-standing history of symptoms of anxiety that have worsened with age. She currently is experiencing obsessions and compulsions that are interfering with her daily functioning and consuming a significant amount of time. Patient also is experiencing depressed mood related to feeling overwhelmed with the obsessions and compulsions. Patient also shares a history of mood swings and states being super happy and then being super sad. Patient also presents with a history of being diagnosed with ADHD and CAPD.  Diagnoses: Obsessive-compulsive disorder, depressive disorder NOS, rule out bipolar disorder ADHD by history   Diagnosis:  Axis I: OCD (obsessive compulsive disorder)          Axis II: Deferred

## 2013-05-08 LAB — OB RESULTS CONSOLE GC/CHLAMYDIA
CHLAMYDIA, DNA PROBE: NEGATIVE
Gonorrhea: NEGATIVE

## 2013-05-08 LAB — OB RESULTS CONSOLE ABO/RH: RH TYPE: POSITIVE

## 2013-05-08 LAB — OB RESULTS CONSOLE RPR: RPR: NONREACTIVE

## 2013-05-08 LAB — OB RESULTS CONSOLE RUBELLA ANTIBODY, IGM: RUBELLA: IMMUNE

## 2013-05-08 LAB — OB RESULTS CONSOLE HIV ANTIBODY (ROUTINE TESTING): HIV: NONREACTIVE

## 2013-05-08 LAB — OB RESULTS CONSOLE ANTIBODY SCREEN: Antibody Screen: NEGATIVE

## 2013-05-08 LAB — OB RESULTS CONSOLE HEPATITIS B SURFACE ANTIGEN: Hepatitis B Surface Ag: NEGATIVE

## 2013-05-15 ENCOUNTER — Telehealth (HOSPITAL_COMMUNITY): Payer: Self-pay | Admitting: *Deleted

## 2013-05-15 NOTE — Telephone Encounter (Signed)
noted 

## 2013-05-16 ENCOUNTER — Ambulatory Visit (HOSPITAL_COMMUNITY): Payer: Self-pay | Admitting: Psychiatry

## 2013-05-17 ENCOUNTER — Ambulatory Visit (HOSPITAL_COMMUNITY): Payer: Self-pay | Admitting: Psychiatry

## 2013-06-04 ENCOUNTER — Ambulatory Visit (INDEPENDENT_AMBULATORY_CARE_PROVIDER_SITE_OTHER): Payer: BC Managed Care – PPO | Admitting: Psychiatry

## 2013-06-04 DIAGNOSIS — F411 Generalized anxiety disorder: Secondary | ICD-10-CM

## 2013-06-04 DIAGNOSIS — F429 Obsessive-compulsive disorder, unspecified: Secondary | ICD-10-CM

## 2013-06-04 NOTE — Patient Instructions (Signed)
Discussed orally 

## 2013-06-04 NOTE — Progress Notes (Signed)
Patient:  Theresa Jackson   DOB: [birth year]  MR Number: [id number]  Location[facility]:  [address] [place],  [place], [id number]  Start: Monday 06/04/2013 9:05  AM End: Monday 06/04/2013 9:55 AM  Provider/Observer:     Florencia ReasonsPeggy Anye Brose, MSW, LCSW   Chief Complaint:      Chief Complaint  Patient presents with  . Anxiety  . Depression   Reason For Service:      The patient is referred for services by Dr Loraine Lerichehomas Kuhn due to patient experiencing symptoms of anxiety and depression. 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 Kem KaysKuhn in the 6th grade. Symptoms have worsened as she has aged. She states being so "paranoid" about her past that it is interfering with her daily functioning. She reports sending pictures of herself dressed in her underwear to a guy 3 or 4 years ago. She now constantly fears he will share the pictures, she will lose her job, and go to jail. She also reports a guy sent her pictures of himself naked several years ago. She checks her flash drive and her phone constantly to make certain the pictures aren't there. Patient also reports thinking if she doesn't touch her flash drive a certain number of times, something bad is going to happen She checks electronics and appliances in her home several times in a certain order every morning and says this process takes about 25 minutes. 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 She also shares memories from childhood where she reports worrying and pulling her hair so frequently that she had open sores in her head. She also remembers asking her father to check her pocketbook each night before she went to bed. Patient reports being so overwhelmed with anxiety and her OCD tendencies that she becomes depressed and thinks she shouldn't be here. Patient is seen for follow up appointment.  Interventions  Strategy:  Supportive therapy, cognitive behavioral therapy, psychoeducation  Participation Level:   Active  Participation Quality:  Appropriate      Behavioral Observation:  Casual, Alert, and fidgety,   Current Psychosocial Factors: Patient recently became pregnant. She reports increased marital stress and conflict.  Content of Session:    Reviewing symptoms, processing feelings, identifying relaxation techniques, identifying realistic expectations of self and interaction with her husband  Current Status:   The patient reports increased anxiety and sadness.  Patient Progress:   Patient reports she is [redacted] weeks pregnant. She is happy about the baby but is experiencing increased marital stress. She is thankful she no longer is working but says husband is always complaining about money. She expresses frustration that husband spends little time with her but does not want her to have much social involvement with her friends. She also reports husband has expectations of certain tasks being accomplished when he arrives home from work. Patient states she can't make him happy. Therapist and patient discuss possibility of marital counseling. However, patient says husband will refuse to go as he thinks she is the one who has all the issues. Therapist works with patient to identify realistic expectations of self and her interaction with her husband as well as identify areas within patient's control. Therapist works with patient to begin to explore ways to improve assertiveness skills. Therapist also reviews relaxation techniques with patient.    Target Goals:  Patient's words: "Stop  checking everything, stop having weird thoughts in my head, stop letting  people get to me, increase my  self-esteem"      1. Decrease intensity and frequency of obsessions and compulsions: 1:1 psychotherapy one time every 1-4 weeks (supportive, cognitive behavioral therapy)   2. Improve assertiveness skills and ability to set  and maintain boundaries: 1:1 psychotherapy one time every 1-4 weeks (supportive, cognitive behavior therapy)   3. Increase self acceptance: 1:1 psychotherapy one time every 1-4 weeks (supportive, cognitive behavioral therapy)  Last Reviewed:  06/02/2012   Goals Addressed Today:    Goal 1, 2, and 3  Impression/Diagnosis:   The patient presents with a long-standing history of symptoms of anxiety that have worsened with age. She currently is experiencing obsessions and compulsions that are interfering with her daily functioning and consuming a significant amount of time. Patient also is experiencing depressed mood related to feeling overwhelmed with the obsessions and compulsions. Patient also shares a history of mood swings and states being super happy and then being super sad. Patient also presents with a history of being diagnosed with ADHD and CAPD.  Diagnoses: Obsessive-compulsive disorder, depressive disorder NOS, rule out bipolar disorder ADHD by history   Diagnosis:  Axis I: OCD (obsessive compulsive disorder)  Generalized anxiety disorder          Axis II: Deferred

## 2013-07-02 ENCOUNTER — Ambulatory Visit (HOSPITAL_COMMUNITY): Payer: Self-pay | Admitting: Psychiatry

## 2013-07-16 ENCOUNTER — Ambulatory Visit (INDEPENDENT_AMBULATORY_CARE_PROVIDER_SITE_OTHER): Payer: BC Managed Care – PPO | Admitting: Psychiatry

## 2013-07-16 DIAGNOSIS — F411 Generalized anxiety disorder: Secondary | ICD-10-CM

## 2013-07-16 DIAGNOSIS — F429 Obsessive-compulsive disorder, unspecified: Secondary | ICD-10-CM

## 2013-07-16 NOTE — Progress Notes (Signed)
   THERAPIST PROGRESS NOTE  Session Time: Monday 07/16/2013 10:00 AM - 10:55 AM  Participation Level: Active  Behavioral Response: CasualAlertAnxious and Depressed  Type of Therapy: Individual Therapy  Treatment Goals addressed:  Improve assertiveness skills and ability to set and maintain boundaries                 Increase self acceptance  Interventions: CBT and Supportive  Summary: Theresa Jackson is a 23 y.o. female who presents with who is referred for services by Dr Loraine Leriche due to patient experiencing symptoms of anxiety and depression. 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 Symptoms have worsened as she has aged. She states being so "paranoid" about her past that it is interfering with her daily functioning. She also experiences poor self- esteem. Since last session 6 weeks ago, she has continued to experience depressed mood and anxiety. She expresses frustration regarding her marriage as husband is insensitive to her needs and does not spend time with her when he can but spends most of his time off work Adult nurse games.. She suspects he may have cheated on her recently and expresses disappointment that husband watches pornography. Per patient's report, husband doesn't help around the house and and procrastinates on completing chores outside like mowing the grass. She is fearful husband will have the same pattern once their baby is born and will not help with the care of the baby. She reports difficulty expressing concerns to husband because she doesn't want to make him angry as he will leave the house for long periods.    Suicidal/Homicidal: No  Therapist Response: Therapist works with patient to identify and verbalize feelings, identifying realistic versus reasonable expectations of the relationship with her husband, identify ways to improve self-care and develop personal interest, identify ways to promote effective  assertion  Plan: Return again in 4 weeks.  Diagnosis: Axis I: Generalized Anxiety Disorder and Obsessive Compulsive Disorder    Axis II: Deferred    [name], LCSW 07/16/2013

## 2013-07-16 NOTE — Patient Instructions (Signed)
Discussed orally 

## 2013-08-13 ENCOUNTER — Ambulatory Visit (INDEPENDENT_AMBULATORY_CARE_PROVIDER_SITE_OTHER): Payer: BC Managed Care – PPO | Admitting: Psychiatry

## 2013-08-13 DIAGNOSIS — F411 Generalized anxiety disorder: Secondary | ICD-10-CM

## 2013-08-13 DIAGNOSIS — F429 Obsessive-compulsive disorder, unspecified: Secondary | ICD-10-CM

## 2013-08-13 NOTE — Patient Instructions (Signed)
Discussed orally 

## 2013-08-13 NOTE — Progress Notes (Signed)
   THERAPIST PROGRESS NOTE  Session Time: Monday 08/13/2013 11:25 AM - 11:55 AM  Participation Level: Active  Behavioral Response: CasualAlertAnxious  Type of Therapy: Individual Therapy  Treatment Goals addressed: Improve assertiveness skills and ability to set and maintain boundaries  Increase self acceptance   Interventions: CBT and Supportive  Summary: Theresa Jackson is a 23 y.o. female who is referred for services by Dr Loraine Lerichehomas Kuhn due to patient experiencing symptoms of anxiety and depression. 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 Kem KaysKuhn in the 6th grade. Symptoms have worsened as she has aged. She states being so "paranoid" about her past that it is interfering with her daily functioning. She also experiences poor self- esteem. Patient reports feeling better since last session. She reports several stressors including the illnesses of family members but states trying not to stress out. She reports strong support from mother who is helping patient decorate the baby's room. Patient has recently learned she is having a boy. She reports less marital stress as husband is spending less time playing video games and  more time with patient. She also reports blaming self less for husband's moods and focusing more on positive self-care.   Suicidal/Homicidal: No  Therapist Response: Therapist works with patient to process feelings, explore relaxation techniques, and reinforce patient's efforts to reframe negative thought patterns, improve assertiveness skills.  Plan: Return again in 5 - 6 weeks.  Diagnosis: Axis I: Generalized Anxiety Disorder, ODD    Axis II: Deferred    Beautiful Pensyl, LCSW 08/13/2013

## 2013-09-24 ENCOUNTER — Ambulatory Visit (HOSPITAL_COMMUNITY): Payer: Self-pay | Admitting: Psychiatry

## 2013-10-03 ENCOUNTER — Ambulatory Visit (INDEPENDENT_AMBULATORY_CARE_PROVIDER_SITE_OTHER): Payer: BC Managed Care – PPO | Admitting: Psychiatry

## 2013-10-03 DIAGNOSIS — F429 Obsessive-compulsive disorder, unspecified: Secondary | ICD-10-CM

## 2013-10-03 DIAGNOSIS — F411 Generalized anxiety disorder: Secondary | ICD-10-CM

## 2013-10-03 NOTE — Progress Notes (Signed)
   THERAPIST PROGRESS NOTE  Session Time: Wednesday 10/03/2013 11:05 AM  Participation Level: Active  Behavioral Response: CasualAlertAnxious  Type of Therapy: Individual Therapy  Treatment Goals addressed: Improve ability to manage stress and anxiety  Interventions: CBT and Supportive  Summary: Theresa BooksRebecca S Jackson is a 23 y.o. female who is referred for services by Dr Loraine Lerichehomas Kuhn due to patient experiencing symptoms of anxiety and depression. 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 Kem KaysKuhn in the 6th grade. Symptoms have worsened as she has aged. She states being so "paranoid" about her past that it is interfering with her daily functioning. She also experiences poor self- esteem.   Patient reports mood has been pretty good since last session. She is excited about her baby who is due on December 24, 2013. She remains very active but is beginning to experience increased fatigue. She reports continued strong support from family and friends. She also reports adjusting her expectations regarding her husband. She continues to experience anxiety and states sometimes feeling on edge.   Suicidal/Homicidal: No  Therapist Response: Therapist works with patient to process feelings, identify relaxation techniques, identify triggers of anxiety, identify thought patterns and connection between thoughts, mood, and behavior, identify and challenge thinking errrors.  Plan: Return again in 5 -6 weeks.  Diagnosis: Axis I: Generalized Anxiety Disorder and Obsessive Compulsive Disorder    Axis II: No diagnosis    Roselie Cirigliano, LCSW 10/03/2013

## 2013-10-03 NOTE — Patient Instructions (Signed)
Discussed orally 

## 2013-11-09 ENCOUNTER — Ambulatory Visit (INDEPENDENT_AMBULATORY_CARE_PROVIDER_SITE_OTHER): Payer: BC Managed Care – PPO | Admitting: Psychiatry

## 2013-11-09 DIAGNOSIS — F429 Obsessive-compulsive disorder, unspecified: Secondary | ICD-10-CM

## 2013-11-09 DIAGNOSIS — F411 Generalized anxiety disorder: Secondary | ICD-10-CM

## 2013-11-09 NOTE — Progress Notes (Signed)
   THERAPIST PROGRESS NOTE  Session Time: Friday 11/09/2013 9:25 AM - 9:55 AM  Participation Level: Active  Behavioral Response: CasualAlertAnxious  Type of Therapy: Individual Therapy  Treatment Goals addressed: Improve ability to manage stress and anxiety  Interventions: CBT and Supportive  Summary: [name] is a 23 y.o. female who presents with is referred for services by Dr Loraine Lerichehomas Kuhn due to patient experiencing symptoms of anxiety and depression. 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 Kem KaysKuhn in the 6th grade. Symptoms have worsened as she has aged. She states being so "paranoid" about her past that it is interfering with her daily functioning. She also experiences poor self- esteem.  Patient reports feeling better about her marriage since last session. Her husband has been more supportive and more understanding. She reports decreased automatic negative thinking about husband. She is excited about her baby who is due on 12/24/2013. She is experiencing increased fatigue due to to increased sleep difficulty as patient is in her last trimester. She has maintained involvement in activities and reports continued strong support from her family and friends. Patient continues to have obsessions and compulsions that delay her leaving home sometimes by 10-15 minutes.   Suicidal/Homicidal: No  Therapist Response: Therapist works with patient to process feelings, explore thought patterns and effects on mood and behavior, identify relaxation techniques, and explore strategies to delay and or eliminate compulsions.  Plan: Return again in 2 months  Diagnosis: Axis I: Generalized Anxiety Disorder and Obsessive Compulsive Disorder    Axis II: No diagnosis    Theordore Cisnero, LCSW 11/09/2013

## 2013-11-09 NOTE — Patient Instructions (Signed)
Discussed orally 

## 2013-11-21 LAB — OB RESULTS CONSOLE GBS: STREP GROUP B AG: POSITIVE

## 2013-12-10 ENCOUNTER — Encounter (HOSPITAL_COMMUNITY): Payer: Self-pay

## 2013-12-10 ENCOUNTER — Inpatient Hospital Stay (HOSPITAL_COMMUNITY)
Admission: AD | Admit: 2013-12-10 | Discharge: 2013-12-10 | Disposition: A | Discharge status: Home / Self Care | Payer: BC Managed Care – PPO | Source: Ambulatory Visit | Attending: Obstetrics and Gynecology | Admitting: Obstetrics and Gynecology

## 2013-12-10 DIAGNOSIS — O479 False labor, unspecified: Secondary | ICD-10-CM | POA: Insufficient documentation

## 2013-12-10 NOTE — MAU Note (Signed)
Patient states she is having contractions every 5 minutes. Denies bleeding or leaking and reports good fetal movement.  

## 2013-12-10 NOTE — Discharge Instructions (Signed)

## 2013-12-16 ENCOUNTER — Encounter (HOSPITAL_COMMUNITY): Payer: BC Managed Care – PPO | Admitting: Anesthesiology

## 2013-12-16 ENCOUNTER — Inpatient Hospital Stay (HOSPITAL_COMMUNITY): Payer: BC Managed Care – PPO | Admitting: Anesthesiology

## 2013-12-16 ENCOUNTER — Encounter (HOSPITAL_COMMUNITY): Payer: Self-pay | Admitting: *Deleted

## 2013-12-16 ENCOUNTER — Inpatient Hospital Stay (HOSPITAL_COMMUNITY)
Admission: AD | Admit: 2013-12-16 | Discharge: 2013-12-19 | DRG: 774 | Disposition: A | Discharge status: Home / Self Care | Payer: BC Managed Care – PPO | Source: Ambulatory Visit | Attending: Obstetrics and Gynecology | Admitting: Obstetrics and Gynecology

## 2013-12-16 DIAGNOSIS — F411 Generalized anxiety disorder: Secondary | ICD-10-CM | POA: Diagnosis present

## 2013-12-16 DIAGNOSIS — Z22330 Carrier of Group B streptococcus: Secondary | ICD-10-CM | POA: Diagnosis not present

## 2013-12-16 DIAGNOSIS — O99344 Other mental disorders complicating childbirth: Secondary | ICD-10-CM | POA: Diagnosis present

## 2013-12-16 DIAGNOSIS — O4290 Premature rupture of membranes, unspecified as to length of time between rupture and onset of labor, unspecified weeks of gestation: Secondary | ICD-10-CM | POA: Diagnosis present

## 2013-12-16 DIAGNOSIS — F909 Attention-deficit hyperactivity disorder, unspecified type: Secondary | ICD-10-CM | POA: Diagnosis present

## 2013-12-16 DIAGNOSIS — IMO0002 Reserved for concepts with insufficient information to code with codable children: Secondary | ICD-10-CM | POA: Diagnosis present

## 2013-12-16 DIAGNOSIS — O99892 Other specified diseases and conditions complicating childbirth: Secondary | ICD-10-CM | POA: Diagnosis present

## 2013-12-16 DIAGNOSIS — O9989 Other specified diseases and conditions complicating pregnancy, childbirth and the puerperium: Secondary | ICD-10-CM

## 2013-12-16 LAB — COMPREHENSIVE METABOLIC PANEL
ALT: 87 U/L — ABNORMAL HIGH (ref 0–35)
ANION GAP: 16 — AB (ref 5–15)
AST: 111 U/L — ABNORMAL HIGH (ref 0–37)
Albumin: 2.8 g/dL — ABNORMAL LOW (ref 3.5–5.2)
Alkaline Phosphatase: 173 U/L — ABNORMAL HIGH (ref 39–117)
BILIRUBIN TOTAL: 0.3 mg/dL (ref 0.3–1.2)
BUN: 9 mg/dL (ref 6–23)
CHLORIDE: 102 meq/L (ref 96–112)
CO2: 19 mEq/L (ref 19–32)
CREATININE: 0.61 mg/dL (ref 0.50–1.10)
Calcium: 9 mg/dL (ref 8.4–10.5)
GFR calc Af Amer: >90 mL/min (ref >90)
GFR calc non Af Amer: >90 mL/min (ref >90)
Glucose, Bld: 84 mg/dL (ref 70–99)
Potassium: 4.4 mEq/L (ref 3.7–5.3)
Sodium: 137 mEq/L (ref 137–147)
Total Protein: 6.3 g/dL (ref 6.0–8.3)

## 2013-12-16 LAB — URINALYSIS, ROUTINE W REFLEX MICROSCOPIC
Bilirubin Urine: NEGATIVE
GLUCOSE, UA: NEGATIVE mg/dL
Hgb urine dipstick: NEGATIVE
Ketones, ur: NEGATIVE mg/dL
Leukocytes, UA: NEGATIVE
Nitrite: NEGATIVE
PROTEIN: 30 mg/dL — AB
Specific Gravity, Urine: 1.01 (ref 1.005–1.030)
Urobilinogen, UA: 2 mg/dL — ABNORMAL HIGH (ref 0.0–1.0)
pH: 6.5 (ref 5.0–8.0)

## 2013-12-16 LAB — CBC
HCT: 38.4 % (ref 36.0–46.0)
HCT: 33.7 % — ABNORMAL LOW (ref 36.0–46.0)
HEMATOCRIT: 40.4 % (ref 36.0–46.0)
HEMOGLOBIN: 13.9 g/dL (ref 12.0–15.0)
Hemoglobin: 14.6 g/dL (ref 12.0–15.0)
Hemoglobin: 12 g/dL (ref 12.0–15.0)
MCH: 34.8 pg — ABNORMAL HIGH (ref 26.0–34.0)
MCH: 34.9 pg — ABNORMAL HIGH (ref 26.0–34.0)
MCH: 34.8 pg — AB (ref 26.0–34.0)
MCHC: 36.1 g/dL — ABNORMAL HIGH (ref 30.0–36.0)
MCHC: 36.2 g/dL — ABNORMAL HIGH (ref 30.0–36.0)
MCHC: 35.6 g/dL (ref 30.0–36.0)
MCV: 96.2 fL (ref 78.0–100.0)
MCV: 96.5 fL (ref 78.0–100.0)
MCV: 97.7 fL (ref 78.0–100.0)
PLATELETS: 154 10*3/uL (ref 150–400)
PLATELETS: 156 10*3/uL (ref 150–400)
Platelets: 187 10*3/uL (ref 150–400)
RBC: 4.2 MIL/uL (ref 3.87–5.11)
RBC: 3.98 MIL/uL (ref 3.87–5.11)
RBC: 3.45 MIL/uL — AB (ref 3.87–5.11)
RDW: 12.1 % (ref 11.5–15.5)
RDW: 12 % (ref 11.5–15.5)
RDW: 12.1 % (ref 11.5–15.5)
WBC: 11.4 10*3/uL — ABNORMAL HIGH (ref 4.0–10.5)
WBC: 10.8 10*3/uL — ABNORMAL HIGH (ref 4.0–10.5)
WBC: 15.6 10*3/uL — AB (ref 4.0–10.5)

## 2013-12-16 LAB — LACTATE DEHYDROGENASE: LDH: 251 U/L — ABNORMAL HIGH (ref 94–250)

## 2013-12-16 LAB — URINE MICROSCOPIC-ADD ON

## 2013-12-16 LAB — URIC ACID: URIC ACID, SERUM: 7.3 mg/dL — AB (ref 2.4–7.0)

## 2013-12-16 LAB — RPR

## 2013-12-16 MED ORDER — PHENYLEPHRINE 40 MCG/ML (10ML) SYRINGE FOR IV PUSH (FOR BLOOD PRESSURE SUPPORT)
80.0000 ug | PREFILLED_SYRINGE | INTRAVENOUS | Status: DC | PRN
  Filled 2013-12-16: qty 10
  Filled 2013-12-16: qty 2

## 2013-12-16 MED ORDER — LACTATED RINGERS IV SOLN
INTRAVENOUS | Status: DC
  Administered 2013-12-16 (×3): via INTRAVENOUS

## 2013-12-16 MED ORDER — FLEET ENEMA 7-19 GM/118ML RE ENEM: 1.0000 | ENEMA | RECTAL | Status: DC | PRN

## 2013-12-16 MED ORDER — FENTANYL 2.5 MCG/ML BUPIVACAINE 1/10 % EPIDURAL INFUSION (WH - ANES): 14.0000 mL/h | INTRAMUSCULAR | Status: DC | PRN

## 2013-12-16 MED ORDER — PHENYLEPHRINE 40 MCG/ML (10ML) SYRINGE FOR IV PUSH (FOR BLOOD PRESSURE SUPPORT)
80.0000 ug | PREFILLED_SYRINGE | INTRAVENOUS | Status: DC | PRN
  Filled 2013-12-16: qty 2

## 2013-12-16 MED ORDER — LIDOCAINE HCL (PF) 1 % IJ SOLN
30.0000 mL | INTRAMUSCULAR | Status: DC | PRN
  Filled 2013-12-16: qty 30

## 2013-12-16 MED ORDER — LACTATED RINGERS IV SOLN
500.0000 mL | Freq: Once | INTRAVENOUS | Status: AC
  Administered 2013-12-16: 500 mL via INTRAVENOUS

## 2013-12-16 MED ORDER — LACTATED RINGERS IV SOLN
500.0000 mL | INTRAVENOUS | Status: DC | PRN
  Administered 2013-12-16: 500 mL via INTRAVENOUS

## 2013-12-16 MED ORDER — LIDOCAINE HCL (PF) 1 % IJ SOLN
INTRAMUSCULAR | Status: DC | PRN
  Administered 2013-12-16: 10 mL

## 2013-12-16 MED ORDER — PENICILLIN G POTASSIUM 5000000 UNITS IJ SOLR
2.5000 10*6.[IU] | INTRAVENOUS | Status: DC
  Administered 2013-12-16 (×2): 2.5 10*6.[IU] via INTRAVENOUS
  Filled 2013-12-16 (×8): qty 2.5

## 2013-12-16 MED ORDER — FENTANYL CITRATE 0.05 MG/ML IJ SOLN
100.0000 ug | INTRAMUSCULAR | Status: DC | PRN
  Administered 2013-12-16 (×2): 100 ug via INTRAVENOUS
  Filled 2013-12-16 (×2): qty 2

## 2013-12-16 MED ORDER — OXYTOCIN 40 UNITS IN LACTATED RINGERS INFUSION - SIMPLE MED: 62.5000 mL/h | INTRAVENOUS | Status: DC

## 2013-12-16 MED ORDER — DIPHENHYDRAMINE HCL 50 MG/ML IJ SOLN: 12.5000 mg | INTRAMUSCULAR | Status: DC | PRN

## 2013-12-16 MED ORDER — MAGNESIUM SULFATE BOLUS VIA INFUSION
4.0000 g | Freq: Once | INTRAVENOUS | Status: AC
  Administered 2013-12-16: 4 g via INTRAVENOUS
  Filled 2013-12-16: qty 500

## 2013-12-16 MED ORDER — MAGNESIUM SULFATE 40 G IN LACTATED RINGERS - SIMPLE
2.0000 g/h | INTRAVENOUS | Status: DC
  Administered 2013-12-16: 2 g/h via INTRAVENOUS
  Filled 2013-12-16: qty 500

## 2013-12-16 MED ORDER — OXYTOCIN 40 UNITS IN LACTATED RINGERS INFUSION - SIMPLE MED
1.0000 m[IU]/min | INTRAVENOUS | Status: DC
  Administered 2013-12-16: 2 m[IU]/min via INTRAVENOUS
  Filled 2013-12-16: qty 1000

## 2013-12-16 MED ORDER — EPHEDRINE 5 MG/ML INJ
10.0000 mg | INTRAVENOUS | Status: DC | PRN
  Filled 2013-12-16: qty 2

## 2013-12-16 MED ORDER — FENTANYL 2.5 MCG/ML BUPIVACAINE 1/10 % EPIDURAL INFUSION (WH - ANES)
14.0000 mL/h | INTRAMUSCULAR | Status: DC | PRN
  Administered 2013-12-16 (×2): 14 mL/h via EPIDURAL
  Filled 2013-12-16 (×2): qty 125

## 2013-12-16 MED ORDER — ONDANSETRON HCL 4 MG/2ML IJ SOLN: 4.0000 mg | Freq: Four times a day (QID) | INTRAMUSCULAR | Status: DC | PRN

## 2013-12-16 MED ORDER — DEXTROSE 5 % IV SOLN
5.0000 10*6.[IU] | Freq: Once | INTRAVENOUS | Status: AC
  Administered 2013-12-16: 5 10*6.[IU] via INTRAVENOUS
  Filled 2013-12-16: qty 5

## 2013-12-16 MED ORDER — TERBUTALINE SULFATE 1 MG/ML IJ SOLN: 0.2500 mg | Freq: Once | INTRAMUSCULAR | Status: AC | PRN

## 2013-12-16 MED ORDER — CITRIC ACID-SODIUM CITRATE 334-500 MG/5ML PO SOLN
30.0000 mL | ORAL | Status: DC | PRN
  Administered 2013-12-16: 30 mL via ORAL
  Filled 2013-12-16: qty 15

## 2013-12-16 MED ORDER — ACETAMINOPHEN 325 MG PO TABS
650.0000 mg | ORAL_TABLET | ORAL | Status: DC | PRN
  Administered 2013-12-16: 650 mg via ORAL
  Filled 2013-12-16: qty 2

## 2013-12-16 MED ORDER — OXYCODONE-ACETAMINOPHEN 5-325 MG PO TABS: 2.0000 | ORAL_TABLET | ORAL | Status: DC | PRN

## 2013-12-16 MED ORDER — OXYTOCIN BOLUS FROM INFUSION
500.0000 mL | INTRAVENOUS | Status: DC
  Administered 2013-12-16: 500 mL via INTRAVENOUS

## 2013-12-16 MED ORDER — OXYCODONE-ACETAMINOPHEN 5-325 MG PO TABS
1.0000 | ORAL_TABLET | ORAL | Status: DC | PRN
  Administered 2013-12-16: 1 via ORAL
  Filled 2013-12-16: qty 1

## 2013-12-16 NOTE — Anesthesia Preprocedure Evaluation (Signed)
Anesthesia Evaluation  Patient identified by MRN, date of birth, ID band Patient awake    Reviewed: Allergy & Precautions, H&P , Patient's Chart, lab work & pertinent test results  Airway Mallampati: II TM Distance: >3 FB Neck ROM: full    Dental  (+) Teeth Intact   Pulmonary  breath sounds clear to auscultation        Cardiovascular Rhythm:regular Rate:Normal     Neuro/Psych PSYCHIATRIC DISORDERS    GI/Hepatic   Endo/Other    Renal/GU      Musculoskeletal   Abdominal   Peds  Hematology   Anesthesia Other Findings       Reproductive/Obstetrics (+) Pregnancy                           Anesthesia Physical Anesthesia Plan  ASA: II  Anesthesia Plan: Epidural   Post-op Pain Management:    Induction:   Airway Management Planned:   Additional Equipment:   Intra-op Plan:   Post-operative Plan:   Informed Consent: I have reviewed the patients History and Physical, chart, labs and discussed the procedure including the risks, benefits and alternatives for the proposed anesthesia with the patient or authorized representative who has indicated his/her understanding and acceptance.   Dental Advisory Given  Plan Discussed with:   Anesthesia Plan Comments: (Labs checked- platelets confirmed with RN in room. Fetal heart tracing, per RN, reported to be stable enough for sitting procedure. Discussed epidural, and patient consents to the procedure:  included risk of possible headache,backache, failed block, allergic reaction, and nerve injury. This patient was asked if she had any questions or concerns before the procedure started.)        Anesthesia Quick Evaluation

## 2013-12-16 NOTE — MAU Note (Signed)
ROM at 0300. Denies feeling any contractions

## 2013-12-16 NOTE — H&P (Addendum)
Theresa Jackson is a 23 y.o. female presenting for PROM clear fluid at 0300.  In MAU, BPs 150s/100s and 1+protein..  No PIH sxs.  Pt is "nervous and anxious"  Pregnancy otherwise uncomplicated.Marland Kitchen History OB History   Grav Para Term Preterm Abortions TAB SAB Ect Mult Living   1              Past Medical History  Diagnosis Date  . ADHD (attention deficit hyperactivity disorder)   . Obsessive-compulsive disorder   . Seizures   . Anxiety   . EYEMVVKP(224.4)    Past Surgical History  Procedure Laterality Date  . Tonsillectomy    . Tubes in ears    . Tooth extraction     Family History: family history includes ADD / ADHD in her brother, maternal aunt, and maternal aunt; Alcohol abuse in her maternal grandmother; Anxiety disorder in her mother; Bipolar disorder in her maternal grandmother; Dementia in her paternal grandmother; Depression in her maternal grandfather; Hip fracture in her paternal grandmother; Seizures in her cousin. There is no history of Drug abuse, OCD, Paranoid behavior, Schizophrenia, Sexual abuse, or Physical abuse. Social History:  reports that she has never smoked. She has never used smokeless tobacco. She reports that she does not drink alcohol or use illicit drugs.   Prenatal Transfer Tool  Maternal Diabetes: No Genetic Screening: Normal Maternal Ultrasounds/Referrals: Normal Fetal Ultrasounds or other Referrals:  None Maternal Substance Abuse:  No Significant Maternal Medications:  None Significant Maternal Lab Results:  None Other Comments:  None  ROS  Dilation: 1 Effacement (%): 60 Station: -2 Exam by:: Quintella Baton rNC Blood pressure 138/91, pulse 83, temperature 97.3 F (36.3 C), temperature source Axillary, resp. rate 20, height 5\' 2"  (1.575 m), weight 76.204 kg (168 lb). Exam DTRs 2/4 No  clonus Physical Exam  Prenatal labs: ABO, Rh: O/Positive/-- (02/03 0000) Antibody: Negative (02/03 0000) Rubella: Immune (02/03 0000) RPR: Nonreactive (02/03  0000)  HBsAg: Negative (02/03 0000)  HIV: Non-reactive (02/03 0000)  GBS: Positive (08/19 0000)   Assessment/Plan: IUP at term with PROM Mild Pre eclampsia - no CNS sxs.  Will hold Mag for now.  Normal labs Pitocin and anticipate SVD  Abx for GBS _+  Bryten Maher C 12/16/2013, 9:41 AM

## 2013-12-16 NOTE — Anesthesia Procedure Notes (Signed)

## 2013-12-17 LAB — COMPREHENSIVE METABOLIC PANEL
ALBUMIN: 2 g/dL — AB (ref 3.5–5.2)
ALK PHOS: 130 U/L — AB (ref 39–117)
ALT: 75 U/L — ABNORMAL HIGH (ref 0–35)
ALT: 74 U/L — ABNORMAL HIGH (ref 0–35)
ANION GAP: 11 (ref 5–15)
AST: 97 U/L — ABNORMAL HIGH (ref 0–37)
AST: 81 U/L — ABNORMAL HIGH (ref 0–37)
Albumin: 2.3 g/dL — ABNORMAL LOW (ref 3.5–5.2)
Alkaline Phosphatase: 130 U/L — ABNORMAL HIGH (ref 39–117)
Anion gap: 12 (ref 5–15)
BILIRUBIN TOTAL: 0.3 mg/dL (ref 0.3–1.2)
BUN: 7 mg/dL (ref 6–23)
BUN: 6 mg/dL (ref 6–23)
CALCIUM: 7.3 mg/dL — AB (ref 8.4–10.5)
CHLORIDE: 103 meq/L (ref 96–112)
CO2: 21 mEq/L (ref 19–32)
CO2: 24 mEq/L (ref 19–32)
Calcium: 7.8 mg/dL — ABNORMAL LOW (ref 8.4–10.5)
Chloride: 103 mEq/L (ref 96–112)
Creatinine, Ser: 0.68 mg/dL (ref 0.50–1.10)
Creatinine, Ser: 0.68 mg/dL (ref 0.50–1.10)
GFR calc Af Amer: >90 mL/min (ref >90)
GFR calc non Af Amer: >90 mL/min (ref >90)
GFR calc non Af Amer: >90 mL/min (ref >90)
GLUCOSE: 81 mg/dL (ref 70–99)
Glucose, Bld: 81 mg/dL (ref 70–99)
Potassium: 4.1 mEq/L (ref 3.7–5.3)
Potassium: 4.4 mEq/L (ref 3.7–5.3)
SODIUM: 136 meq/L — AB (ref 137–147)
Sodium: 138 mEq/L (ref 137–147)
TOTAL PROTEIN: 4.6 g/dL — AB (ref 6.0–8.3)
TOTAL PROTEIN: 5.6 g/dL — AB (ref 6.0–8.3)
Total Bilirubin: <0.2 mg/dL — ABNORMAL LOW (ref 0.3–1.2)

## 2013-12-17 LAB — CBC
HEMATOCRIT: 33.5 % — AB (ref 36.0–46.0)
HEMATOCRIT: 34.6 % — AB (ref 36.0–46.0)
HEMOGLOBIN: 12 g/dL (ref 12.0–15.0)
Hemoglobin: 12 g/dL (ref 12.0–15.0)
MCH: 34.8 pg — ABNORMAL HIGH (ref 26.0–34.0)
MCH: 33.9 pg (ref 26.0–34.0)
MCHC: 35.8 g/dL (ref 30.0–36.0)
MCHC: 34.7 g/dL (ref 30.0–36.0)
MCV: 97.1 fL (ref 78.0–100.0)
MCV: 97.7 fL (ref 78.0–100.0)
Platelets: 127 10*3/uL — ABNORMAL LOW (ref 150–400)
Platelets: 127 10*3/uL — ABNORMAL LOW (ref 150–400)
RBC: 3.45 MIL/uL — AB (ref 3.87–5.11)
RBC: 3.54 MIL/uL — ABNORMAL LOW (ref 3.87–5.11)
RDW: 12.2 % (ref 11.5–15.5)
RDW: 12.4 % (ref 11.5–15.5)
WBC: 13.1 10*3/uL — AB (ref 4.0–10.5)
WBC: 11.4 10*3/uL — ABNORMAL HIGH (ref 4.0–10.5)

## 2013-12-17 LAB — ABO/RH: ABO/RH(D): O POS

## 2013-12-17 LAB — MRSA PCR SCREENING: MRSA by PCR: NEGATIVE

## 2013-12-17 LAB — URIC ACID: Uric Acid, Serum: 6.8 mg/dL (ref 2.4–7.0)

## 2013-12-17 MED ORDER — LANOLIN HYDROUS EX OINT: TOPICAL_OINTMENT | CUTANEOUS | Status: DC | PRN

## 2013-12-17 MED ORDER — PRENATAL MULTIVITAMIN CH
1.0000 | ORAL_TABLET | Freq: Every day | ORAL | Status: DC
  Administered 2013-12-17 – 2013-12-19 (×3): 1 via ORAL
  Filled 2013-12-17 (×3): qty 1

## 2013-12-17 MED ORDER — SODIUM CHLORIDE 0.9 % IJ SOLN: 3.0000 mL | INTRAMUSCULAR | Status: DC | PRN

## 2013-12-17 MED ORDER — DIPHENHYDRAMINE HCL 25 MG PO CAPS: 25.0000 mg | ORAL_CAPSULE | Freq: Four times a day (QID) | ORAL | Status: DC | PRN

## 2013-12-17 MED ORDER — INFLUENZA VAC SPLIT QUAD 0.5 ML IM SUSY
0.5000 mL | PREFILLED_SYRINGE | INTRAMUSCULAR | Status: AC
  Administered 2013-12-19: 0.5 mL via INTRAMUSCULAR
  Filled 2013-12-17 (×2): qty 0.5

## 2013-12-17 MED ORDER — LACTATED RINGERS IV SOLN
INTRAVENOUS | Status: AC
  Administered 2013-12-17 (×2): via INTRAVENOUS

## 2013-12-17 MED ORDER — MAGNESIUM SULFATE 40 G IN LACTATED RINGERS - SIMPLE
2.0000 g/h | INTRAVENOUS | Status: DC
  Administered 2013-12-17: 2 g/h via INTRAVENOUS
  Filled 2013-12-17: qty 500

## 2013-12-17 MED ORDER — OXYCODONE-ACETAMINOPHEN 5-325 MG PO TABS: 2.0000 | ORAL_TABLET | ORAL | Status: DC | PRN

## 2013-12-17 MED ORDER — MAGNESIUM SULFATE 40 G IN LACTATED RINGERS - SIMPLE
2.0000 g/h | INTRAVENOUS | Status: DC
  Filled 2013-12-17: qty 500

## 2013-12-17 MED ORDER — IBUPROFEN 600 MG PO TABS
600.0000 mg | ORAL_TABLET | Freq: Four times a day (QID) | ORAL | Status: DC
  Administered 2013-12-17 – 2013-12-19 (×11): 600 mg via ORAL
  Filled 2013-12-17 (×11): qty 1

## 2013-12-17 MED ORDER — ONDANSETRON HCL 4 MG PO TABS
4.0000 mg | ORAL_TABLET | ORAL | Status: DC | PRN
Start: 2013-12-17 — End: 2013-12-19
  Administered 2013-12-17: 4 mg via ORAL
  Filled 2013-12-17: qty 1

## 2013-12-17 MED ORDER — SODIUM CHLORIDE 0.9 % IJ SOLN: 3.0000 mL | Freq: Two times a day (BID) | INTRAMUSCULAR | Status: DC

## 2013-12-17 MED ORDER — SIMETHICONE 80 MG PO CHEW: 80.0000 mg | CHEWABLE_TABLET | ORAL | Status: DC | PRN

## 2013-12-17 MED ORDER — BENZOCAINE-MENTHOL 20-0.5 % EX AERO
1.0000 "application " | INHALATION_SPRAY | CUTANEOUS | Status: DC | PRN
  Administered 2013-12-17 – 2013-12-19 (×2): 1 via TOPICAL
  Filled 2013-12-17 (×2): qty 56

## 2013-12-17 MED ORDER — SENNOSIDES-DOCUSATE SODIUM 8.6-50 MG PO TABS
2.0000 | ORAL_TABLET | ORAL | Status: DC
  Administered 2013-12-17 – 2013-12-18 (×3): 2 via ORAL
  Filled 2013-12-17 (×3): qty 2

## 2013-12-17 MED ORDER — MEDROXYPROGESTERONE ACETATE 150 MG/ML IM SUSP: 150.0000 mg | INTRAMUSCULAR | Status: DC | PRN

## 2013-12-17 MED ORDER — DIBUCAINE 1 % RE OINT
1.0000 | TOPICAL_OINTMENT | RECTAL | Status: DC | PRN
Start: 2013-12-17 — End: 2013-12-19
  Administered 2013-12-18: 1 via RECTAL
  Filled 2013-12-17: qty 28

## 2013-12-17 MED ORDER — WITCH HAZEL-GLYCERIN EX PADS: 1.0000 "application " | MEDICATED_PAD | CUTANEOUS | Status: DC | PRN

## 2013-12-17 MED ORDER — TETANUS-DIPHTH-ACELL PERTUSSIS 5-2.5-18.5 LF-MCG/0.5 IM SUSP
0.5000 mL | Freq: Once | INTRAMUSCULAR | Status: DC
  Filled 2013-12-17: qty 0.5

## 2013-12-17 MED ORDER — MEASLES, MUMPS & RUBELLA VAC ~~LOC~~ INJ
0.5000 mL | INJECTION | Freq: Once | SUBCUTANEOUS | Status: DC
  Filled 2013-12-17: qty 0.5

## 2013-12-17 MED ORDER — ONDANSETRON HCL 4 MG/2ML IJ SOLN: 4.0000 mg | INTRAMUSCULAR | Status: DC | PRN

## 2013-12-17 MED ORDER — OXYCODONE-ACETAMINOPHEN 5-325 MG PO TABS: 1.0000 | ORAL_TABLET | ORAL | Status: DC | PRN

## 2013-12-17 MED ORDER — ZOLPIDEM TARTRATE 5 MG PO TABS: 5.0000 mg | ORAL_TABLET | Freq: Every evening | ORAL | Status: DC | PRN

## 2013-12-17 NOTE — Lactation Note (Addendum)
This note was copied from the chart of Theresa Carle Vandaele. Lactation Consultation Note   Follow up consult with this mom and baby, now 72 hours old, and full term. Mom i in AICU, still on magnesium drip. The baby has been latching with a 20 nipple shield, but I am not sure she was applying correctly. The baby is spitty and passing flatus.  Mom said she was just placing the shield on her nipple, and not advancing the baby further. I tried a 16 shiled,  Applied with suction, and nabout 1/4 of the shield was filled with mom's niple. The baby was awake and alert, but not suing or sucking. I had mom begin pumpin in premie setting, and advised her to try and pump every 3 hours. Mom now feeling well, and is still on mag, so I told her to do her best.  After pumping, I was able to hand express 5 mls of colostrum, and spoon fed this to the baby. Mom knows to call for questions/concerns to lactation as needed, and knows that an LC is available until 11 pm tonight.  On exam of baby's mouth, I noted that he may have a posterior , tight frenulum. Patient Name: Theresa Jackson OIZTI'W Date: 12/17/2013 Reason for consult: Follow-up assessment   Maternal Data    Feeding Feeding Type: Breast Fed Length of feed: 0 min  LATCH Score/Interventions Latch: Too sleepy or reluctant, no latch achieved, no sucking elicited. (16 nipple shiled applied - hardly any nipple in shiled, baby advanced past shiled, awake, would not suckle) Intervention(s): Skin to skin;Teach feeding cues;Waking techniques Intervention(s): Adjust position;Assist with latch;Breast compression  Audible Swallowing: None Intervention(s): Hand expression  Type of Nipple: Flat Intervention(s): Double electric pump  Comfort (Breast/Nipple): Soft / non-tender     Hold (Positioning): Assistance needed to correctly position infant at breast and maintain latch. Intervention(s): Support Pillows;Position options;Skin to skin;Breastfeeding basics  reviewed  LATCH Score: 4  Lactation Tools Discussed/Used Tools: Nipple Shields Nipple shield size: 16   Consult Status Consult Status: Follow-up Date: 12/18/13 Follow-up type: In-patient    Alfred Levins 12/17/2013, 2:57 PM

## 2013-12-17 NOTE — Progress Notes (Signed)
UR chart review completed.  

## 2013-12-17 NOTE — Anesthesia Postprocedure Evaluation (Signed)
Anesthesia Post Note  Patient: Theresa Jackson  Procedure(s) Performed: * No procedures listed *  Anesthesia type: Epidural  Patient location: Mother/Baby  Post pain: Pain level controlled  Post assessment: Post-op Vital signs reviewed  Last Vitals:  Filed Vitals:   12/17/13 0632  BP: 130/98  Pulse: 96  Temp:   Resp: 18    Post vital signs: Reviewed  Level of consciousness:alert  Complications: No apparent anesthesia complications

## 2013-12-17 NOTE — Lactation Note (Signed)
This note was copied from the chart of Theresa Jackson. Lactation Consultation Note  Patient Name: Theresa Jackson PXTGG'Y Date: 12/17/2013 Reason for consult: Initial assessment of this mom and baby at 9 hours postpartum. Mom is 23 yo primipara who attended prenatal BF classes at [facility].  She is in AICU on Mag and is sleepy but has recently fed baby for 20 minutes using a #20 NS and assisted by her nurse to latch baby.  RN, Marcella informs LC that she observed deep latch and strong sucking bursts.  DEBP is at bedside for PRN use but mom has been too exhausted to pump and prefers directly nursing, if possible. RN has shown mom hand expression and encouraged STS and cue feedings, which LC reinforced with FOB present and awake. Mom encouraged to feed baby 8-12 times/24 hours and with feeding cues. LC encouraged review of Baby and Me pp 9, 14 and 20-25 for STS and BF information. LC provided [employer] brochure and reviewed [facility] services and list of community and web site resources. Mom asked about pacifier use and says she had heard it is best to avoid pacifier for at least 2 weeks.  LC reinforced the reason for this, as well as bottle-feeding and formula supplement, if not medically indicated.      Maternal Data Formula Feeding for Exclusion: Yes Reason for exclusion: Admission to Intensive Care Unit (ICU) post-partum Has patient been taught Hand Expression?: Yes (per RN) Does the patient have breastfeeding experience prior to this delivery?: No  Feeding Feeding Type: Breast Fed  LATCH Score/Interventions Latch: Repeated attempts needed to sustain latch, nipple held in mouth throughout feeding, stimulation needed to elicit sucking reflex. Intervention(s): Assist with latch  Audible Swallowing: A few with stimulation Intervention(s): Skin to skin  Type of Nipple: Flat  Comfort (Breast/Nipple): Soft / non-tender     Hold (Positioning): Assistance needed to correctly position infant  at breast and maintain latch. Intervention(s): Support Pillows  LATCH Score: 6 (RN assessment)  Lactation Tools Discussed/Used DEBP Initiated by:: RN placed at bedside; mom still needs instructions Date initiated:: 12/17/13 STS, cue feedings, hand expression Pacifier use  Consult Status Consult Status: Follow-up Date: 12/17/13 Follow-up type: In-patient    Warrick Parisian [facility] 12/17/2013, 6:10 AM

## 2013-12-17 NOTE — Progress Notes (Signed)
Tired  Filed Vitals:   12/17/13 0755  BP: 133/92  Pulse: 93  Temp: 98.3 F (36.8 C)  Resp: 16   Lungs CTA Cor RRR DTR 1+  Results for orders placed during the hospital encounter of 12/16/13 (from the past 24 hour(s))  CBC     Status: Abnormal   Collection Time    12/16/13  1:40 PM      Result Value Ref Range   WBC 10.8 (*) 4.0 - 10.5 K/uL   RBC 3.98  3.87 - 5.11 MIL/uL   Hemoglobin 13.9  12.0 - 15.0 g/dL   HCT 07.6  22.6 - 33.3 %   MCV 96.5  78.0 - 100.0 fL   MCH 34.9 (*) 26.0 - 34.0 pg   MCHC 36.2 (*) 30.0 - 36.0 g/dL   RDW 54.5  62.5 - 63.8 %   Platelets 154  150 - 400 K/uL  ABO/RH     Status: None   Collection Time    12/16/13  1:40 PM      Result Value Ref Range   ABO/RH(D) O POS    CBC     Status: Abnormal   Collection Time    12/16/13 10:20 PM      Result Value Ref Range   WBC 15.6 (*) 4.0 - 10.5 K/uL   RBC 3.45 (*) 3.87 - 5.11 MIL/uL   Hemoglobin 12.0  12.0 - 15.0 g/dL   HCT 93.7 (*) 34.2 - 87.6 %   MCV 97.7  78.0 - 100.0 fL   MCH 34.8 (*) 26.0 - 34.0 pg   MCHC 35.6  30.0 - 36.0 g/dL   RDW 81.1  57.2 - 62.0 %   Platelets 156  150 - 400 K/uL  MRSA PCR SCREENING     Status: None   Collection Time    12/17/13 12:00 AM      Result Value Ref Range   MRSA by PCR NEGATIVE  NEGATIVE  CBC     Status: Abnormal   Collection Time    12/17/13  6:55 AM      Result Value Ref Range   WBC 13.1 (*) 4.0 - 10.5 K/uL   RBC 3.45 (*) 3.87 - 5.11 MIL/uL   Hemoglobin 12.0  12.0 - 15.0 g/dL   HCT 35.5 (*) 97.4 - 16.3 %   MCV 97.1  78.0 - 100.0 fL   MCH 34.8 (*) 26.0 - 34.0 pg   MCHC 35.8  30.0 - 36.0 g/dL   RDW 84.5  36.4 - 68.0 %   Platelets 127 (*) 150 - 400 K/uL    CMET pending  A/P: preeclampsia         PP         Check CMET         Continue magnesium until at least 24 hours PP

## 2013-12-17 NOTE — Progress Notes (Signed)
CSW acknowledges consult as MOB presents with a history of anxiety.  MOB is currently receiving magnesium.  CSW will complete assessment once MOB is no longer receiving magnesium.  

## 2013-12-18 LAB — CBC
HCT: 32.7 % — ABNORMAL LOW (ref 36.0–46.0)
Hemoglobin: 11.3 g/dL — ABNORMAL LOW (ref 12.0–15.0)
MCH: 34.2 pg — AB (ref 26.0–34.0)
MCHC: 34.6 g/dL (ref 30.0–36.0)
MCV: 99.1 fL (ref 78.0–100.0)
PLATELETS: 138 10*3/uL — AB (ref 150–400)
RBC: 3.3 MIL/uL — AB (ref 3.87–5.11)
RDW: 12.5 % (ref 11.5–15.5)
WBC: 11.5 10*3/uL — AB (ref 4.0–10.5)

## 2013-12-18 LAB — COMPREHENSIVE METABOLIC PANEL
ALT: 61 U/L — AB (ref 0–35)
AST: 61 U/L — ABNORMAL HIGH (ref 0–37)
Albumin: 2.1 g/dL — ABNORMAL LOW (ref 3.5–5.2)
Alkaline Phosphatase: 118 U/L — ABNORMAL HIGH (ref 39–117)
Anion gap: 12 (ref 5–15)
BUN: 9 mg/dL (ref 6–23)
CALCIUM: 7.1 mg/dL — AB (ref 8.4–10.5)
CHLORIDE: 102 meq/L (ref 96–112)
CO2: 24 meq/L (ref 19–32)
Creatinine, Ser: 0.6 mg/dL (ref 0.50–1.10)
GFR calc Af Amer: >90 mL/min (ref >90)
Glucose, Bld: 90 mg/dL (ref 70–99)
Potassium: 4.1 mEq/L (ref 3.7–5.3)
SODIUM: 138 meq/L (ref 137–147)
Total Bilirubin: <0.2 mg/dL — ABNORMAL LOW (ref 0.3–1.2)
Total Protein: 5.3 g/dL — ABNORMAL LOW (ref 6.0–8.3)

## 2013-12-18 MED ORDER — LABETALOL HCL 100 MG PO TABS
100.0000 mg | ORAL_TABLET | Freq: Two times a day (BID) | ORAL | Status: DC
  Administered 2013-12-18 – 2013-12-19 (×2): 100 mg via ORAL
  Filled 2013-12-18 (×2): qty 1

## 2013-12-18 NOTE — Progress Notes (Signed)
Post Partum Day 2 Subjective: no complaints and up ad lib  Objective: Blood pressure 138/90, pulse 86, temperature 98.4 F (36.9 C), temperature source Oral, resp. rate 16, height 5\' 2"  (1.575 m), weight 69.491 kg (153 lb 3.2 oz), SpO2 99.00%, unknown if currently breastfeeding.  Physical Exam:  General: alert, cooperative and appears stated age 23: appropriate Uterine Fundus: firm Incision: healing well DVT Evaluation: No evidence of DVT seen on physical exam.   Recent Labs  12/17/13 1828 12/18/13 0558  HGB 12.0 11.3*  HCT 34.6* 32.7*    Assessment/Plan: Plan for discharge tomorrow and Circumcision prior to discharge Preeclampsia labs improving discontinue Magnesium and transfer to floor   LOS: 2 days   Theresa Jackson 12/18/2013, 10:25 AM

## 2013-12-18 NOTE — Lactation Note (Signed)
This note was copied from the chart of Theresa Jackson. Lactation Consultation Note      Follow up consult with this mom of a term baby, now 36 hours old. Mom has been unable to get baby latched, due to very large breasts with flat nipples. She supplemnted baby with 12 mls fo formula twice during the night. I assured her that this was a good idea. I aissisted mom with eld. The baby latched eagerly with strong suckles intermittently. I then syringe fed him 12 mls fo formula into the shield, and he tolerated this well. He continued to breast feed after this 12 mls, but I left the room at this time. Mom states she really wants to breast feed, despite her breast tenderness and flat nipples, etc. Mom plans to pump and hand express every 3 hours, and to continue to supplement with formula according to the amounts chart. Mom will also try and brest feed, but  Is aware this will take time to accomplish. I told mom hopefully the baby will be more eager to sustain a latch once her milk transitions in. Mom knows to call for questions/concerns.   Patient Name: Theresa Jackson ZWCHE'N Date: 12/18/2013 Reason for consult: Follow-up assessment   Maternal Data    Feeding Feeding Type: Formula Length of feed: 10 min  LATCH Score/Interventions Latch: Repeated attempts needed to sustain latch, nipple held in mouth throughout feeding, stimulation needed to elicit sucking reflex. (baby almost latched without shiled, but was not able to latch well enough tos timulate sucking, due to flat nipples, baby id latch after 20 nipple shiled applied, although shiled only filled about 1/3 otf the was with mom's nipple) Intervention(s): Skin to skin;Teach feeding cues;Waking techniques Intervention(s): Adjust position;Assist with latch;Breast massage;Breast compression  Audible Swallowing: None Intervention(s): Skin to skin;Hand expression  Type of Nipple: Flat Intervention(s): Double electric pump  Comfort  (Breast/Nipple): Filling, red/small blisters or bruises, mild/mod discomfort  Problem noted: Mild/Moderate discomfort (mom is very sensitive to any breast touching) Interventions (Mild/moderate discomfort): Hand massage;Hand expression;Pre-pump if needed  Hold (Positioning): Assistance needed to correctly position infant at breast and maintain latch. Intervention(s): Breastfeeding basics reviewed;Support Pillows;Position options;Skin to skin  LATCH Score: 4  Lactation Tools Discussed/Used Tools: Nipple Shields Nipple shield size: 20   Consult Status Consult Status: Follow-up Date: 12/19/13 Follow-up type: In-patient    Alfred Levins 12/18/2013, 2:50 PM

## 2013-12-18 NOTE — Progress Notes (Signed)
Dr Vincente Poli notified of pt's B/P since transferring for AICU.  Orders given

## 2013-12-19 MED ORDER — LABETALOL HCL 100 MG PO TABS: 100.0000 mg | ORAL_TABLET | Freq: Two times a day (BID) | ORAL | Status: DC

## 2013-12-19 MED ORDER — IBUPROFEN 600 MG PO TABS: 600.0000 mg | ORAL_TABLET | Freq: Four times a day (QID) | ORAL | Status: DC

## 2013-12-19 NOTE — Plan of Care (Signed)
Problem: Problem: Cardiovascular Progression Goal: NO EVIDENCE OF VOLUME OVERLOAD Good output 600 ml this AM

## 2013-12-19 NOTE — Progress Notes (Signed)
Clinical Social Work Department PSYCHOSOCIAL ASSESSMENT - MATERNAL/CHILD March 10, 2014  Patient:  BERNIE, RANSFORD  Account Number:  [id number]  [place] Date:  Feb 10, 2014  Ardine Eng Name:   Merian Capron Ulbricht   Clinical Social Worker:  Lucita Ferrara, CLINICAL SOCIAL WORKER   Date/Time:  04-12-2013 08:45 AM  Date Referred:  07/12/2013   Referral source  Central Nursery     Referred reason  Depression/Anxiety   Other referral source:    I:  FAMILY / HOME ENVIRONMENT Child's legal guardian:  PARENT  Guardian - Name Guardian - Age Guardian - [place] [address] [place], [place] [address]  Pennie Rushing  same as above   Other household support members/support persons Name Relationship DOB   MOTHER    Other support:   Per MOB, her mother lives in the same neighborhood.  She reported feeling supported by her mother, and her mother confirmed that she is available to offer assistance as needed.    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 She stated that she does not plan to return to work at this time, but denied financial stressors as the FOB works.   Financial resources:  Multimedia programmer If [place]:   Other  [place] / Grade:  N/A Music therapist / Child Services Coordination / Early Interventions:   N/A  Cultural issues impacting care:   None reported    III  STRENGTHS Strengths  Adequate Resources  Home prepared for Child (including basic supplies)  Supportive family/friends   Strength comment:  MGM present at bedside and was actively involved and observed to be supportive to MOB and the baby.   IV  RISK FACTORS AND CURRENT PROBLEMS Current Problem:  YES   Risk Factor & Current Problem Patient Issue Family Issue Risk Factor  / Current Problem Comment  Mental Illness Y N MOB presents with mental health history signficiant for anxiety.  MOB is currently receiving therpay from Maurice Small at the [facility] outpatient mental health clinic in [place].  She is not currently on medication, but received medication management with Dr. Harrington Challenger prior to her pregnancy. She reported preference to not re-start medications at this time, but is willing if needed.    V  SOCIAL WORK ASSESSMENT CSW met with the MOB in her room in order to complete the assessment. Consult was ordered due to MOB presenting with a history of anxiety.  CSW received consult on 9/13, but CSW was unable to assess earlier due to MOB being in the AICU and receiving magnesium.  CSW met with MOB and MGM with MOB's consent.  FOB was also present, but he was observed to be sleeping throughout the [place] visit.  MOB was observed to be attentive to the newborn, displayed full range in affect, and presented in an appropriate mood.   CSW did not note any acute symptoms of anxiety, but was able to hear MOB's tendency to become anxious when she begins to think into the future.  MOB presented with self-awareness related to her anxious thoughts, and presented with awareness of needing to continue to learn how to manage her anxiety.   MOB smiled as she reflected upon her thoughts and feelings as she transitions into the postpartum period.  She stated that she feels well  supported by her family and that there are few psychosocial stressors at this time.  Upon further exploration, MOB stated that she quit her job 7 months ago due to being overwhelmed since she was working at a day care and there was not sufficient staff to meet the needs of the children.  As CSW continued to assist MOB process this stressor, CSW and MOB began to process and discuss MOB's history of anxiety.  MOB reported history of anxiety and depression as a middle school and high school student, including a history of  participating in therapy and medication management.  She stated that prior to her pregnancy, she was prescribed two medications, but that she discontinued the medication with the guidance of Dr. Harrington Challenger at [facility] in [place]. She stated that she has been regularly seeing Maurice Small for therapy and she shared belief that she has a strong therapeutic relationship with Peggy.  MOB discussed how therapy assisted her when she first quit her job as she had been feeling overwhelmed.  MOB shared that once she adjusted/coped with quitting her job, her symptoms stabilized. She shared that she would prefer to not re-start medications, but is willing if her symptoms escalate.  MOB was receptive to recommendation to follow-up with Peggy within 1-2 weeks of discharge from the hospital since she is likely to experience increase in anxiety due to her mental health history.  MOB receptive to the recommendation.  MOB was also receptive to education on postpartum depression and is willing to reach out to providers sooner if symptoms occur.   CSW continued to assist MOB process her feelings of anxiety as she prepares to return home.  She stated that she does worry a lot that he will stop breathing when he is sleeping.  Per MOB, she has slept minimally since he has been born due to this worry.  MOB demonstrated ability to look forward and identify how her lack of sleep would negatively impact her ability to care for her baby.  She is aware that it is unrealistic for the baby to be monitored/watched at all times since sleep is necessary, but she continues to struggle to disengage from the need to constantly watch him.  CSW validated MOB's worry since she loves and wants him to be healthy, but also confirmed with MOB that numerous babies and mother sleep at the same together without breathing stopping.  MOB also expressed worry related to difficulties with breast feeding due to her inverted  nipples.  CSW validated and normalized her feelings, and MOB quickly transitioned to how she is more concerned about his health than breast feeding.  She presented with awareness that it is more important for him to receive nutrition than in which format he received the nutrition from.  As CSW heard MOB's thought process and worries, CSW encouraged MOB to identify what is lost when she worries and thinks too far into the future.  MOB presented with insight that it takes away from her ability to focus on the present moment.  She did express desire to become more present focused since she does not like how it feels to be anxious.    No barriers to discharge.    VI SOCIAL WORK PLAN Social Work Therapist, art  No Further Intervention Required / No Barriers to Discharge   Type of pt/family education:   Postpartum depression and anxiety   If child protective services report - county:   If child  protective services report - date:   Information/referral to community resources comment:   Other social work plan:   CSW recommended that MOB schedule appointment with outpatient therapist within 1-2 weeks of returning home.  CSW to provide ongoing emotional support PRN.

## 2013-12-19 NOTE — Discharge Summary (Signed)
Obstetric Discharge Summary Reason for Admission: rupture of membranes Prenatal Procedures: ultrasound Intrapartum Procedures: spontaneous vaginal delivery Postpartum Procedures: none Complications-Operative and Postpartum: 2 degree perineal laceration Hemoglobin  Date Value Ref Range Status  12/18/2013 11.3* 12.0 - 15.0 g/dL Final     HCT  Date Value Ref Range Status  12/18/2013 32.7* 36.0 - 46.0 % Final    Physical Exam:  General: alert and cooperative Lochia: appropriate Uterine Fundus: firm Incision: perineum intact DVT Evaluation: No evidence of DVT seen on physical exam. Negative Homan's sign. No cords or calf tenderness. Calf/Ankle edema is present. DTR's 2-3+ , no clonus  Discharge Diagnoses: Term Pregnancy-delivered and Preelampsia  Discharge Information: Date: 12/19/2013 Activity: pelvic rest Diet: routine Medications: PNV, Ibuprofen and Colace and Labetalol Condition: stable Instructions: refer to practice specific booklet Discharge to: home, PIH precautions reviewed, RTO in 2 days for bp check   Newborn Data: Live born female  Birth Weight: 7 lb 9.2 oz (3436 g) APGAR: 9, 9  Home with mother.  Krystianna Soth G 12/19/2013, 7:25 AM

## 2013-12-25 ENCOUNTER — Ambulatory Visit (INDEPENDENT_AMBULATORY_CARE_PROVIDER_SITE_OTHER): Payer: BC Managed Care – PPO | Admitting: Psychiatry

## 2013-12-25 DIAGNOSIS — F329 Major depressive disorder, single episode, unspecified: Secondary | ICD-10-CM

## 2013-12-25 DIAGNOSIS — F429 Obsessive-compulsive disorder, unspecified: Secondary | ICD-10-CM

## 2013-12-25 DIAGNOSIS — F3289 Other specified depressive episodes: Secondary | ICD-10-CM

## 2013-12-25 DIAGNOSIS — F411 Generalized anxiety disorder: Secondary | ICD-10-CM

## 2013-12-25 NOTE — Patient Instructions (Signed)
Discussed orally 

## 2013-12-25 NOTE — Progress Notes (Addendum)
   THERAPIST PROGRESS NOTE  Session Time: Tuesday 12/25/2013 2:10 PM -3:00 PM  Participation Level: Active  Behavioral Response: CasualAlertAnxious and Depressed/tearful  Type of Therapy: Individual Therapy  Treatment Goals addressed: Improve ability to manage stress and anxiety  Interventions: CBT and Supportive  Summary: Theresa Jackson is a 23 y.o. female who is referred for services by Dr Loraine Leriche due to patient experiencing symptoms of anxiety and depression. 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 Symptoms have worsened as she has aged. She states being so "paranoid" about her past that it is interfering with her daily functioning. She also experiences poor self- esteem.  Patient was last seen on 11/09/2013. She reports having her baby on 12/16/2013 and being in ICU for 2 days due to having preeclampsia. She reports being very stressed and emotional since being home.  She is worried constantly that something may happen to her baby and fears he is going to die from SIDS. She is constantly checking on baby to make certain he is okay.  She also is stressed about breast feeding as baby isn't latching on and she is having to frequently use breast pump. Between baby's sleep schedule and her pumping schedule, she reports sleeping very little. She also is worried if husband will be able to help with the baby once he returns to work tomorrow. She reports excessive worry about future. Patient report feeling overwhelmed.   Suicidal/Homicidal: No  Therapist Response: Therapist works with patient to process her feelings, discuss referral to psychiatrist Dr. Tenny Craw for medication management, identify coping statements, identify ways to use support system, identify relaxation techniques, identifyi ways to intervene in negative spiraling thoughts.  Plan: Return again in 1 weeks. Patient agrees to see psychiatrist Dr. Tenny Craw for medication management and is  scheduled to see her on 12/26/2013  Diagnosis: Axis I: Obsessive-compulsive disorder, generalized anxiety disorder, depressive disorder    Axis II: Deferred    Johanthan Kneeland, LCSW 12/25/2013

## 2013-12-26 ENCOUNTER — Encounter (HOSPITAL_COMMUNITY): Payer: Self-pay | Admitting: Psychiatry

## 2013-12-26 ENCOUNTER — Ambulatory Visit (INDEPENDENT_AMBULATORY_CARE_PROVIDER_SITE_OTHER): Payer: BC Managed Care – PPO | Admitting: Psychiatry

## 2013-12-26 VITALS — BP 136/98 | HR 74 | Ht 62.0 in | Wt 143.4 lb

## 2013-12-26 DIAGNOSIS — F429 Obsessive-compulsive disorder, unspecified: Secondary | ICD-10-CM

## 2013-12-26 DIAGNOSIS — F909 Attention-deficit hyperactivity disorder, unspecified type: Secondary | ICD-10-CM

## 2013-12-26 DIAGNOSIS — F411 Generalized anxiety disorder: Secondary | ICD-10-CM

## 2013-12-26 MED ORDER — SERTRALINE HCL 100 MG PO TABS: 100.0000 mg | ORAL_TABLET | Freq: Every day | ORAL | Status: DC

## 2013-12-26 MED ORDER — ALPRAZOLAM 0.5 MG PO TABS: 0.5000 mg | ORAL_TABLET | Freq: Three times a day (TID) | ORAL | Status: DC | PRN

## 2013-12-26 NOTE — Progress Notes (Signed)
Patient ID: Theresa Jackson, female   DOB: [birth year], 23 y.o.   MRN: [id number] Patient ID: Theresa Jackson, female   DOB: 02/15/1991, 23 y.o.   MRN: [id number] Patient ID: Theresa Jackson, female   DOB: Dec 27, 1990, 23 y.o.   MRN: [id number] Keylin Ferryman 1992  Subjective I am not doing well  History of present illness. Patient is 23 year old Caucasian married employed female who came for her followup appointment  Her job has been stressful. 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 She often has headaches and feels stressed and overwhelmed. She would like to have something to take for the stress. She had tried Xanax once before for tooth extraction and it really helped her feel calm She also has ADD however she does not have any symptoms of attention focus and concentration problems.  She does not feel that she requires medication for ADD.  Her OCD is much better with the medication.  She sleeping better.  She's not drinking or using any illegal substance.  Patient has not seen therapist for more than 2 months.  Patient admitted, she has busy schedule however she will like to followup with a therapist in the future.  The patient returns after 10 months. She got pregnant and stopped all of her medications. Her baby was born on 12/16/2013. She did have some complications with preeclampsia and had to spend 2 days in the ICU. Since the baby has been home she's had a difficult time. Her OCD has kicked in full force. She's constantly checking the baby to make sure he is breathing. She's not able to sleep well. She's been trying to breast-feed the baby is not latching on and the pumping is really hurting so she's made a decision to stop. He is actually pretty easy-going baby and sleeps  3-4 hours after he has been fed. She denies any thoughts of hurting herself or the baby. She just seems overwhelmed and is starting to get depressed again.  She had a good response to Zoloft in the past for depression OCD and she states Xanax has helped in the past for anxiety. I am agreeable to starting both these medications along with her counseling. Stopping the breast-feeding will remove a lot of stress from the situation  .  Filed Vitals:   12/26/13 1056  BP: 136/98  Pulse: 74   Mental Status Examination Patient is well groomed well dressed female who appears to be in her stated age.  She is appears anxious and tired. Her affect is dysphoric  Her speech is fluent and coherent.  Her thought processes are logical linear and goal-directed.  There were no tremors or shakes.  She denies any active or passive suicidal thoughts or homicidal thoughts.  She denies any auditory or visual hallucination.  There were no paranoia or delusions present at this time.  Her psychomotor activity is diminished  She's alert and oriented x3.  Her insight judgment and impulse control is okay.  Current Medication Current outpatient prescriptions:docusate sodium (COLACE) 50 MG capsule, Take 100 mg by mouth daily., Disp: , Rfl: ;  ibuprofen (ADVIL,MOTRIN) 600 MG tablet, Take 1 tablet (600 mg total) by mouth every 6 (six) hours., Disp: 30 tablet, Rfl: 1;  labetalol (NORMODYNE) 100 MG tablet, Take 1 tablet (100 mg total) by mouth 2 (two) times daily., Disp: 60 tablet, Rfl: 1 Prenatal Vit-Fe Fumarate-FA (PRENATAL MULTIVITAMIN) TABS tablet, Take 1 tablet by mouth daily., Disp: ,  Rfl: ;  ALPRAZolam (XANAX) 0.5 MG tablet, Take 1 tablet (0.5 mg total) by mouth 3 (three) times daily as needed for sleep or anxiety., Disp: 30 tablet, Rfl: 1;  sertraline (ZOLOFT) 100 MG tablet, Take 1 tablet (100 mg total) by mouth daily., Disp: 30 tablet, Rfl: 2   Assessment Axis I obsessive-compulsive disorder, ADD by history Axis II deferred Axis III recent infection Axis IV mild Axis V 70-75  Plan  The patient will restart Zoloft 50 mg daily for one week then advance to 100 mg daily. She'll  start Xanax 0.5 mg 3 times a day as needed for anxiety. She'll call if any of her symptoms worsen but otherwise will return in 4 weeks.  Diannia Ruder MD

## 2014-01-01 ENCOUNTER — Other Ambulatory Visit (HOSPITAL_COMMUNITY): Payer: Self-pay | Admitting: Psychiatry

## 2014-01-01 ENCOUNTER — Ambulatory Visit (INDEPENDENT_AMBULATORY_CARE_PROVIDER_SITE_OTHER): Payer: BC Managed Care – PPO | Admitting: Psychiatry

## 2014-01-01 DIAGNOSIS — F429 Obsessive-compulsive disorder, unspecified: Secondary | ICD-10-CM

## 2014-01-01 DIAGNOSIS — F411 Generalized anxiety disorder: Secondary | ICD-10-CM

## 2014-01-01 MED ORDER — ALPRAZOLAM 0.5 MG PO TABS: 0.5000 mg | ORAL_TABLET | Freq: Four times a day (QID) | ORAL | Status: DC

## 2014-01-01 NOTE — Patient Instructions (Signed)
Discussed orally 

## 2014-01-01 NOTE — Progress Notes (Signed)
   THERAPIST PROGRESS NOTE  Session Time:  Tuesday 01/01/2014 10:05 AM - 11:00 AM  Participation Level: Active  Behavioral Response: CasualAlertAnxious  Type of Therapy: Individual Therapy  Treatment Goals addressed: Improve ability to manage stress and anxiety  Interventions: CBT and Supportive  Summary: Theresa Jackson is a 24 y.o. female who is referred for services by Dr Loraine Leriche due to patient experiencing symptoms of anxiety and depression. 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 Symptoms have worsened as she has aged. She states being so "paranoid" about her past that it is interfering with her daily functioning. She also experiences poor self- esteem  Patient reports feeling better since last session. She has seen psychiatrist Dr. Tenny Craw and has begun taking Zoloft and Xanax. She reports feeling less overwhelmed and experiencing improved mood. Patient also reports decreased stress as she decided to discontinue breast-feeding She also reports less worry about the baby and no longer is doing compulsive checking. She is experiencing stress related to still trying to please others and continues to have difficulty saying no. She cites recent examples in situation with her family. She also expresses frustration husband is not as involved with the care of the baby since his return to work.  Suicidal/Homicidal: No  Therapist Response: Therapist works with patient to process feelings, identify realistic expectations of self, identify ways to improve assertiveness skills, and review relaxation techniques.  Plan: Return again in 2 weeks.  Diagnosis: Axis I: Generalized Anxiety Disorder and Obsessive Compulsive Disorder    Axis II: No diagnosis    [name], LCSW 01/01/2014

## 2014-01-23 ENCOUNTER — Encounter (HOSPITAL_COMMUNITY): Payer: Self-pay | Admitting: Psychiatry

## 2014-01-23 ENCOUNTER — Ambulatory Visit (INDEPENDENT_AMBULATORY_CARE_PROVIDER_SITE_OTHER): Payer: BC Managed Care – PPO | Admitting: Psychiatry

## 2014-01-23 VITALS — BP 117/79 | HR 92 | Ht 62.0 in | Wt 137.0 lb

## 2014-01-23 DIAGNOSIS — F42 Obsessive-compulsive disorder: Secondary | ICD-10-CM

## 2014-01-23 DIAGNOSIS — F900 Attention-deficit hyperactivity disorder, predominantly inattentive type: Secondary | ICD-10-CM

## 2014-01-23 DIAGNOSIS — F411 Generalized anxiety disorder: Secondary | ICD-10-CM

## 2014-01-23 DIAGNOSIS — F429 Obsessive-compulsive disorder, unspecified: Secondary | ICD-10-CM

## 2014-01-23 MED ORDER — SERTRALINE HCL 100 MG PO TABS: 100.0000 mg | ORAL_TABLET | Freq: Every day | ORAL | Status: DC

## 2014-01-23 MED ORDER — ALPRAZOLAM 0.5 MG PO TABS: 0.5000 mg | ORAL_TABLET | Freq: Four times a day (QID) | ORAL | Status: DC

## 2014-01-23 NOTE — Progress Notes (Signed)
Patient ID: [name], female   DOB: 07-16-1990, 23 y.o.   MRN: [id number] Patient ID: [name], female   DOB: 07-16-1990, 23 y.o.   MRN: [id number] Patient ID: [name], female   DOB: 07-16-1990, 23 y.o.   MRN: [id number] Patient ID: [name], female   DOB: 07-16-1990, 23 y.o.   MRN: [id number] Theresa BattyRebecca S [birth year]  Subjective I am doing somewhat better  History of present illness. Patient is 23 year old Caucasian married employed female who came for her followup appointment  Her job has been stressful. 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 She often has headaches and feels stressed and overwhelmed. She would like to have something to take for the stress. She had tried Xanax once before for tooth extraction and it really helped her feel calm She also has ADD however she does not have any symptoms of attention focus and concentration problems.  She does not feel that she requires medication for ADD.  Her OCD is much better with the medication.  She sleeping better.  She's not drinking or using any illegal substance.  Patient has not seen therapist for more than 2 months.  Patient admitted, she has busy schedule however she will like to followup with a therapist in the future.  The patient returns after 10 months. She got pregnant and stopped all of her medications. Her baby was born on 12/16/2013. She did have some complications with preeclampsia and had to spend 2 days in the ICU. Since the baby has been home she's had a difficult time. Her OCD has kicked in full force. She's constantly checking the baby to make sure he is breathing. She's not able to sleep well. She's been trying to breast-feed the baby is not latching on and the pumping is really hurting so she's made a decision to stop. He is actually pretty easy-going baby and sleeps  3-4 hours after he has been fed. She denies any thoughts of  hurting herself or the baby. She just seems overwhelmed and is starting to get depressed again. She had a good response to Zoloft in the past for depression OCD and she states Xanax has helped in the past for anxiety. I am agreeable to starting both these medications along with her counseling. Stopping the breast-feeding will remove a lot of stress from the situation  The patient returns again after four-week's. She is taking Zoloft and Xanax. She's feeling much better and is less anxious and depressed. She denies suicidal ideation and she is sleeping well. Unfortunately she found out that her father-in-law has lung cancer and this has upset  the entire family.  Ceasar Mons.  Filed Vitals:   01/23/14 1153  BP: 117/79  Pulse: 92   Mental Status Examination Patient is well groomed well dressed female who appears to be in her stated age.  She is much less anxious and her affect is brighter.   Her speech is fluent and coherent.  Her thought processes are logical linear and goal-directed.  There were no tremors or shakes.  She denies any active or passive suicidal thoughts or homicidal thoughts.  She denies any auditory or visual hallucination.  There were no paranoia or delusions present at this time.  Her psychomotor activity is diminished  She's alert and oriented x3.  Her insight judgment and impulse control is okay.  Current Medication Current outpatient prescriptions:ALPRAZolam (XANAX) 0.5 MG tablet, Take 1 tablet (0.5  mg total) by mouth 4 (four) times daily., Disp: 360 tablet, Rfl: 2;  docusate sodium (COLACE) 50 MG capsule, Take 100 mg by mouth 2 (two) times daily. , Disp: , Rfl: ;  ibuprofen (ADVIL,MOTRIN) 600 MG tablet, Take 1 tablet (600 mg total) by mouth every 6 (six) hours., Disp: 30 tablet, Rfl: 1 Prenatal Vit-Fe Fumarate-FA (PRENATAL MULTIVITAMIN) TABS tablet, Take 1 tablet by mouth daily., Disp: , Rfl: ;  sertraline (ZOLOFT) 100 MG tablet, Take 1 tablet (100 mg total) by mouth daily., Disp: 90  tablet, Rfl: 2   Assessment Axis I obsessive-compulsive disorder, ADD by history Axis II deferred Axis III recent infection Axis IV mild Axis V 70-75  Plan  The patient will continue 100 mg daily. And Xanax 0.5 mg 3 times a day as needed for anxiety. She'll call if any of her symptoms worsen but otherwise will return in 6 weeks.  Diannia Ruder MD

## 2014-01-28 ENCOUNTER — Telehealth (HOSPITAL_COMMUNITY): Payer: Self-pay | Admitting: *Deleted

## 2014-01-28 ENCOUNTER — Ambulatory Visit (INDEPENDENT_AMBULATORY_CARE_PROVIDER_SITE_OTHER): Payer: BC Managed Care – PPO | Admitting: Psychiatry

## 2014-01-28 DIAGNOSIS — F429 Obsessive-compulsive disorder, unspecified: Secondary | ICD-10-CM

## 2014-01-28 DIAGNOSIS — F411 Generalized anxiety disorder: Secondary | ICD-10-CM

## 2014-01-28 DIAGNOSIS — F42 Obsessive-compulsive disorder: Secondary | ICD-10-CM

## 2014-01-28 NOTE — Telephone Encounter (Signed)
Pt came in today 01-28-14 wanted to know if Dr. Tenny Crawoss could increase her Xanax without having her to come in. Per pt she is having more stress at home due to father in law cancer and having to deal with everything. Pt number is [phone]

## 2014-01-28 NOTE — Progress Notes (Signed)
   THERAPIST PROGRESS NOTE  Session Time: Monday 01/28/2014 1:20 PM - 2:05 PM  Participation Level: Active  Behavioral Response: CasualAlertAnxious  Type of Therapy: Individual Therapy  Treatment Goals addressed: Improve ability to manage stress and anxiety  Interventions: CBT and Supportive  Summary: Theresa BooksRebecca S Jackson is a 23 y.o. female who presents who is referred for services by Dr Loraine Lerichehomas Kuhn due to patient experiencing symptoms of anxiety and depression. 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 Kem KaysKuhn in the 6th grade. Symptoms have worsened as she has aged. She states being so "paranoid" about her past that it is interfering with her daily functioning. She also experiences poor self- esteem.   The patient reports increased stress and anxiety as her father-in-law recently was diagnosed with stage IV lung cancer. Her husband is the oldest sibling and feels responsible for taking care of his mother and siblings per patient's report. He no longer is helping with household chores or daily responsibilities for the baby. Patient also reports husband has been more irritable and argumentative. She also expresses frustration as she reports husband has money to take care of their financial affairs but uses it to help his biological family and expects patient's parents to help her and the baby. Patient reports becoming more anxious and feeling as though she needs an increase in her medication. Patient will leave message with medical assistant for Dr. Tenny Crawoss regarding medication  Suicidal/Homicidal: No  Therapist Response: Therapist works with patient to process feelings, to identify realistic expectations of self and to set priorities, to identify coping statements, and to review relaxation techniques.  Plan: Return again in 2 weeks.  Diagnosis: Axis I: Generalized Anxiety Disorder and Obsessive Compulsive Disorder    Axis II: No diagnosis    Tynasia Mccaul,  LCSW 01/28/2014

## 2014-01-28 NOTE — Patient Instructions (Signed)
Discussed orally 

## 2014-01-29 NOTE — Telephone Encounter (Signed)
She was given 360 tablets of the 0.5 mg-- She could take two of these up to 4 times a day

## 2014-02-04 ENCOUNTER — Encounter (HOSPITAL_COMMUNITY): Payer: Self-pay | Admitting: Psychiatry

## 2014-02-11 ENCOUNTER — Ambulatory Visit (INDEPENDENT_AMBULATORY_CARE_PROVIDER_SITE_OTHER): Payer: BLUE CROSS/BLUE SHIELD | Admitting: Psychiatry

## 2014-02-11 DIAGNOSIS — F411 Generalized anxiety disorder: Secondary | ICD-10-CM

## 2014-02-11 NOTE — Psych (Deleted)
Patient:  Theresa Jackson   DOB: 10-05-1990  MR Number: [id number]  [facility]  [address] [place],  [place], [id number]  Start: Monday 02/11/2014 11:15 AM  End: Monday 02/11/2014 12:10 PM  Provider/Observer:     Florencia ReasonsPeggy Bynum, MSW, LCSW    Participation Level: Active  Behavioral Response: CasualAlertAnxious  Type of Therapy: Individual Therapy  Treatment Goals addressed: Implement coping skills alleviating symptoms of anxiety ( excessive worry, crying spells, restlessness)        Improve assertiveness skills and ability to set/maintain boundaries  Interventions: CBT and Supportive  Summary: Theresa Jackson is a 23 y.o. female who presents who is referred for services by Dr Loraine Lerichehomas Kuhn due to patient experiencing symptoms of anxiety and depression. 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 Kem KaysKuhn in the 6th grade. Symptoms have worsened as she has aged. She states being so "paranoid" about her past that it is interfering with her daily functioning. She also experiences poor self- esteem.   The patient reports increased stress and anxiety as she and her husband are experiencing continued issues regarding repairs to their home. She reports feeling caught in the middle between her husband and her parents. She  became anxious and overwhelmed yesterday due to disagreement between husband and mother. Patient continues to have difficulty setting boundaries and says she wants to learn how to stop letting people walk over her. She reports less depressed mood and decreased compulsions and obsessions.   Suicidal/Homicidal: No  Therapist Response: Therapist works with patient to revise treatment plan, discuss boundary issues in her relationships, practice completing anxiety log.  Plan: Return again in 2 weeks. Patient agrees to complete anxiety logs and bring to next session.    Diagnosis:  Axis I: Generalized anxiety disorder          Axis  II: No diagnosis

## 2014-02-11 NOTE — Patient Instructions (Signed)
Discussed orally 

## 2014-02-12 NOTE — Progress Notes (Signed)
                  THERAPIST PROGRESS NOTE  Session Time: Monday 02/11/2014 11:15 PM - 12:10 PM  Participation Level: Active  Behavioral Response: CasualAlertAnxious  Type of Therapy: Individual Therapy  Treatment Goals addressed: implement coping skills alleviating symptoms of anxiety (excessive worry, restlessness, and crying spells)         Improve assertiveness skills and ability to set and maintain boundaries  Interventions: CBT and Supportive  Summary: [name] is a 23 y.o. female who presents who is referred for services by Dr Loraine Leriche due to patient experiencing symptoms of anxiety and depression. 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 Symptoms have worsened as she has aged. She states being so "paranoid" about her past that it is interfering with her daily functioning. She also experiences poor self- esteem.   The patient reports increased stress and anxiety as she and her husband are experiencing continued issues regarding work on their house. She reports feeling caught in the middle between her husband and her parents. She became anxious and overwhelmed yesterday due to a disagreement between her husband and her mother. Patient continues to have difficulty setting boundaries and says she wants to learn how to stop bleeding people walk all for her. She reports less depressed mood and decreased obsessions and compulsions.  Suicidal/Homicidal: No  Therapist Response: Therapist works with patient to revised treatment plan, process feelings, discussing boundary issues in her relationships, and practice completing anxiety log  Plan: Return again in 2 weeks. Patient agrees to complete anxiety logs and bring to next session.  Diagnosis: Axis I: Generalized Anxiety Disorder and Obsessive Compulsive Disorder    Axis II: No diagnosis    Demetrio Leighty, LCSW 02/12/2014

## 2014-02-19 ENCOUNTER — Ambulatory Visit (HOSPITAL_COMMUNITY): Payer: Self-pay | Admitting: Psychiatry

## 2014-02-22 ENCOUNTER — Ambulatory Visit (INDEPENDENT_AMBULATORY_CARE_PROVIDER_SITE_OTHER): Payer: BC Managed Care – PPO | Admitting: Psychiatry

## 2014-02-22 DIAGNOSIS — F411 Generalized anxiety disorder: Secondary | ICD-10-CM

## 2014-02-22 DIAGNOSIS — F42 Obsessive-compulsive disorder: Secondary | ICD-10-CM

## 2014-02-22 DIAGNOSIS — F429 Obsessive-compulsive disorder, unspecified: Secondary | ICD-10-CM

## 2014-02-22 NOTE — Progress Notes (Signed)
                    THERAPIST PROGRESS NOTE  Session Time: Monday 02/22/2014 3:25 PM - 4:10 PM  Participation Level: Active  Behavioral Response: CasualAlertAnxious  Type of Therapy: Individual Therapy  Treatment Goals addressed: implement coping skills alleviating symptoms of anxiety (excessive worry, restlessness, and crying spells)         Improve assertiveness skills and ability to set and maintain boundaries  Interventions: CBT and Supportive  Summary: [name] is a 23 y.o. female who presents who is referred for services by Dr Loraine Lerichehomas Kuhn due to patient experiencing symptoms of anxiety and depression. 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 Kem KaysKuhn in the 6th grade. Symptoms have worsened as she has aged. She states being so "paranoid" about her past that it is interfering with her daily functioning. She also experiences poor self- esteem.   The patient is seen today  earlier than her scheduled appointment today as she  reports increased stress and anxiety related to conflict with a neighbor and conflict with mother. Per patient's report, mother became angry and upset with her due to patient's husband emailing a complaint regarding services couple received from their neighbor's company. Mother didn't want couple to cause problems for the neighbor per patient's report. She reports she was able to be assertive with mother and express her feelings about being hurt by mother and being fearful of disappointing mother. She reports positive response from mother who apologized. Patient reports continued obsessions about bugs and compulsions to wash self and check self and baby but says these are beginning to decrease. Suicidal/Homicidal: No  Therapist Response: Therapist works with patient to praise her efforts to use assertiveness skills, use pie chart to dispel challenge obsessions, identify effects of childhood and relationship with family on her  current functioning and thought patterns  Plan: Return again in 2 weeks. Patient agrees to complete anxiety logs and bring to next session.  Diagnosis: Axis I: Generalized Anxiety Disorder and Obsessive Compulsive Disorder    Axis II: No diagnosis    [name], LCSW 02/22/2014

## 2014-02-22 NOTE — Patient Instructions (Signed)
Discussed orally 

## 2014-02-25 ENCOUNTER — Ambulatory Visit (HOSPITAL_COMMUNITY): Payer: Self-pay | Admitting: Psychiatry

## 2014-02-26 NOTE — Telephone Encounter (Signed)
Pt is aware and shows understanding 

## 2014-03-04 ENCOUNTER — Telehealth (HOSPITAL_COMMUNITY): Payer: Self-pay | Admitting: *Deleted

## 2014-03-04 ENCOUNTER — Ambulatory Visit (INDEPENDENT_AMBULATORY_CARE_PROVIDER_SITE_OTHER): Payer: BC Managed Care – PPO | Admitting: Psychiatry

## 2014-03-04 ENCOUNTER — Encounter (HOSPITAL_COMMUNITY): Payer: Self-pay | Admitting: Psychiatry

## 2014-03-04 VITALS — BP 103/82 | HR 74 | Ht 62.0 in | Wt 130.0 lb

## 2014-03-04 DIAGNOSIS — F411 Generalized anxiety disorder: Secondary | ICD-10-CM

## 2014-03-04 DIAGNOSIS — F909 Attention-deficit hyperactivity disorder, unspecified type: Secondary | ICD-10-CM

## 2014-03-04 DIAGNOSIS — F42 Obsessive-compulsive disorder: Secondary | ICD-10-CM

## 2014-03-04 MED ORDER — SERTRALINE HCL 100 MG PO TABS: 100.0000 mg | ORAL_TABLET | Freq: Every day | ORAL | Status: DC

## 2014-03-04 MED ORDER — ALPRAZOLAM 0.5 MG PO TABS: 0.5000 mg | ORAL_TABLET | Freq: Four times a day (QID) | ORAL | Status: DC

## 2014-03-04 NOTE — Progress Notes (Signed)
Patient ID: [name], female   DOB: [birth year], 23 y.o.   MRN: [id number] Patient ID: [name], female   DOB: [birth year], 23 y.o.   MRN: [id number] Patient ID: [name], female   DOB: [birth year], 23 y.o.   MRN: [id number] Patient ID: [name], female   DOB: [birth year], 23 y.o.   MRN: [id number] Patient ID: [name], female   DOB: [birth year], 23 y.o.   MRN: [id number] Theresa BattyRebecca S Madrigal01/19/[id number]  Subjective I am doing somewhat better  History of present illness. Patient is 23 year old Caucasian married employed female who came for her followup appointment  Her job has been stressful. 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 She often has headaches and feels stressed and overwhelmed. She would like to have something to take for the stress. She had tried Xanax once before for tooth extraction and it really helped her feel calm She also has ADD however she does not have any symptoms of attention focus and concentration problems.  She does not feel that she requires medication for ADD.  Her OCD is much better with the medication.  She sleeping better.  She's not drinking or using any illegal substance.  Patient has not seen therapist for more than 2 months.  Patient admitted, she has busy schedule however she will like to followup with a therapist in the future.  The patient returns after 10 months. She got pregnant and stopped all of her medications. Her baby was born on 12/16/2013. She did have some complications with preeclampsia and had to spend 2 days in the ICU. Since the baby has been home she's had a difficult time. Her OCD has kicked in full force. She's constantly checking the baby to make sure he is breathing. She's not able to sleep well. She's been trying to breast-feed the baby is not latching on and the pumping is really hurting so she's made a decision to stop. He is actually pretty easy-going  baby and sleeps  3-4 hours after he has been fed. She denies any thoughts of hurting herself or the baby. She just seems overwhelmed and is starting to get depressed again. She had a good response to Zoloft in the past for depression OCD and she states Xanax has helped in the past for anxiety. I am agreeable to starting both these medications along with her counseling. Stopping the breast-feeding will remove a lot of stress from the situation  The patient returns again after 6 weeks. She seems to be doing better and is less overwhelmed. She is having stress with her husband because he doesn't always tell her about  financial issues. Her mood is improved on the medication and she only uses the Xanax twice a day. The baby is now 3011 weeks old and doing extremely well  .  Filed Vitals:   03/04/14 1117  BP: 103/82  Pulse: 74   Mental Status Examination Patient is well groomed well dressed female who appears to be in her stated age.  She is much less anxious and her affect is brighter.   Her speech is fluent and coherent.  Her thought processes are logical linear and goal-directed.  There were no tremors or shakes.  She denies any active or passive suicidal thoughts or homicidal thoughts.  She denies any auditory or visual hallucination.  There were no paranoia or delusions present at this time.  Her psychomotor activity is diminished  She's alert and oriented x3.  Her insight judgment and impulse control is okay.  Current Medication Current outpatient prescriptions: ALPRAZolam (XANAX) 0.5 MG tablet, Take 1 tablet (0.5 mg total) by mouth 4 (four) times daily., Disp: 360 tablet, Rfl: 2;  docusate sodium (COLACE) 50 MG capsule, Take 100 mg by mouth 2 (two) times daily. , Disp: , Rfl: ;  ibuprofen (ADVIL,MOTRIN) 600 MG tablet, Take 1 tablet (600 mg total) by mouth every 6 (six) hours., Disp: 30 tablet, Rfl: 1 Prenatal Vit-Fe Fumarate-FA (PRENATAL MULTIVITAMIN) TABS tablet, Take 1 tablet by mouth daily., Disp:  , Rfl: ;  sertraline (ZOLOFT) 100 MG tablet, Take 1 tablet (100 mg total) by mouth daily., Disp: 90 tablet, Rfl: 2;  sertraline (ZOLOFT) 100 MG tablet, Take 1 tablet (100 mg total) by mouth daily., Disp: 90 tablet, Rfl: 2   Assessment Axis I obsessive-compulsive disorder, ADD by history Axis II deferred Axis III recent infection Axis IV mild Axis V 70-75  Plan  The patient will continue 100 mg daily. And Xanax 0.5 mg 4 times a day as needed for anxiety. She'll call if any of her symptoms worsen but otherwise will return in 2 months  Diannia Ruder MD

## 2014-03-04 NOTE — Telephone Encounter (Signed)
patient is seeing Dr. Tenny Craw today.   Her Medicaid ends today and they have had a lot of unexpected bills.   She wants to know if she can wait until Jan. to come back to see you.

## 2014-03-11 ENCOUNTER — Ambulatory Visit (HOSPITAL_COMMUNITY): Payer: Self-pay | Admitting: Psychiatry

## 2014-03-25 ENCOUNTER — Ambulatory Visit (HOSPITAL_COMMUNITY): Payer: Self-pay | Admitting: Psychiatry

## 2014-04-18 ENCOUNTER — Ambulatory Visit (HOSPITAL_COMMUNITY): Payer: Self-pay | Admitting: Psychiatry

## 2014-04-22 ENCOUNTER — Ambulatory Visit (INDEPENDENT_AMBULATORY_CARE_PROVIDER_SITE_OTHER): Payer: BLUE CROSS/BLUE SHIELD | Admitting: Psychiatry

## 2014-04-22 DIAGNOSIS — F411 Generalized anxiety disorder: Secondary | ICD-10-CM

## 2014-04-22 DIAGNOSIS — F42 Obsessive-compulsive disorder: Secondary | ICD-10-CM

## 2014-04-22 DIAGNOSIS — F429 Obsessive-compulsive disorder, unspecified: Secondary | ICD-10-CM

## 2014-04-22 NOTE — Progress Notes (Signed)
                     THERAPIST PROGRESS NOTE  Session Time: Monday 04/22/2014 4:15 PM - 4:50 PM  Participation Level: Active  Behavioral Response: CasualAlertAnxious  Type of Therapy: Individual Therapy  Treatment Goals addressed: implement coping skills alleviating symptoms of anxiety (excessive worry, restlessness, and crying spells)         Improve assertiveness skills and ability to set and maintain boundaries  Interventions: CBT and Supportive  Summary: Theresa Jackson is a 24 y.o. female who presents who is referred for services by Dr Loraine Leriche due to patient experiencing symptoms of anxiety and depression. 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 Symptoms have worsened as she has aged. She states being so "paranoid" about her past that it is interfering with her daily functioning. She also experiences poor self- esteem.   The patient was last seen in November 2015. She reports adjusting well to taking care of her baby. She continues to experience anxiety and excessive worry about a variety of issues including her weight. She reports exercising daily and being very strict regarding a low calorie intake. She also reports worry about possibility she and husband may eventually have to assume responsibility for his younger siblings as his parents have health issues.    Suicidal/Homicidal: No  Therapist Response: Therapist works with patient to identify and verbalize feelings, identify ways to reduce excessive worry ( time limit and distracting activities), review relaxation techniques, begin to identify cognitive distortions  Plan: Return again in 3 weeks.  Diagnosis: Axis I: Generalized Anxiety Disorder and Obsessive Compulsive Disorder    Axis II: No diagnosis    Burnie Hank, LCSW 04/22/2014

## 2014-04-22 NOTE — Patient Instructions (Signed)
Discussed orally 

## 2014-05-02 ENCOUNTER — Encounter (HOSPITAL_COMMUNITY): Payer: Self-pay | Admitting: Psychiatry

## 2014-05-02 ENCOUNTER — Ambulatory Visit (INDEPENDENT_AMBULATORY_CARE_PROVIDER_SITE_OTHER): Payer: BLUE CROSS/BLUE SHIELD | Admitting: Psychiatry

## 2014-05-02 VITALS — Ht 62.0 in | Wt 124.0 lb

## 2014-05-02 DIAGNOSIS — F411 Generalized anxiety disorder: Secondary | ICD-10-CM

## 2014-05-02 DIAGNOSIS — F909 Attention-deficit hyperactivity disorder, unspecified type: Secondary | ICD-10-CM

## 2014-05-02 DIAGNOSIS — F42 Obsessive-compulsive disorder: Secondary | ICD-10-CM

## 2014-05-02 MED ORDER — SERTRALINE HCL 100 MG PO TABS: 100.0000 mg | ORAL_TABLET | Freq: Every day | ORAL | Status: DC

## 2014-05-02 MED ORDER — ALPRAZOLAM 0.5 MG PO TABS: 0.5000 mg | ORAL_TABLET | Freq: Four times a day (QID) | ORAL | Status: DC

## 2014-05-02 NOTE — Progress Notes (Signed)
Patient ID: Theresa Jackson, female   DOB: [birth year], 24 y.o.   MRN: [id number] Patient ID: Theresa Jackson, female   DOB: 17-May-1990, 24 y.o.   MRN: [id number] Patient ID: Theresa Jackson, female   DOB: 02-Jun-1990, 24 y.o.   MRN: [id number] Patient ID: Theresa Jackson, female   DOB: [birth year], 24 y.o.   MRN: [id number] Patient ID: Theresa Jackson, female   DOB: 09-23-1990, 24 y.o.   MRN: [id number] Patient ID: Theresa Jackson, female   DOB: 03-11-1991, 24 y.o.   MRN: [id number] Theresa Craigo [id number]  Subjective I am doing  better  History of present illness. Patient is 24 year old Caucasian married employed female who came for her followup appointment  Her job has been stressful. 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 She often has headaches and feels stressed and overwhelmed. She would like to have something to take for the stress. She had tried Xanax once before for tooth extraction and it really helped her feel calm She also has ADD however she does not have any symptoms of attention focus and concentration problems.  She does not feel that she requires medication for ADD.  Her OCD is much better with the medication.  She sleeping better.  She's not drinking or using any illegal substance.  Patient has not seen therapist for more than 2 months.  Patient admitted, she has busy schedule however she will like to followup with a therapist in the future.  The patient returns after 24 months. She got pregnant and stopped all of her medications. Her baby was born on 12/16/2013. She did have some complications with preeclampsia and had to spend 2 days in the ICU. Since the baby has been home she's had a difficult time. Her OCD has kicked in full force. She's constantly checking the baby to make sure he is breathing. She's not able to sleep well. She's been trying to breast-feed the baby is not latching on and the pumping is really  hurting so she's made a decision to stop. He is actually pretty easy-going baby and sleeps  3-4 hours after he has been fed. She denies any thoughts of hurting herself or the baby. She just seems overwhelmed and is starting to get depressed again. She had a good response to Zoloft in the past for depression OCD and she states Xanax has helped in the past for anxiety. I am agreeable to starting both these medications along with her counseling. Stopping the breast-feeding will remove a lot of stress from the situation  The patient returns again after 24 months. The patient is here with the baby who is now 24 months old. She is doing very well. She is exercising daily and has lost some weight. Her energy and mood are good and she only uses the Xanax twice a day for anxiety. 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  There were no vitals filed for this visit. Mental Status Examination Patient is well groomed well dressed female who appears to be in her stated age.  She is much less anxious and her affect is bright   Her speech is fluent and coherent.  Her thought processes are logical linear and goal-directed.  There were no tremors or shakes.  She denies any active or passive suicidal thoughts or homicidal thoughts.  She denies any auditory or visual hallucination.  There were no paranoia  or delusions present at this time.  Her psychomotor activity is diminished  She's alert and oriented x3.  Her insight judgment and impulse control is okay.  Current Medication  Current outpatient prescriptions:  .  ALPRAZolam (XANAX) 0.5 MG tablet, Take 1 tablet (0.5 mg total) by mouth 4 (four) times daily., Disp: 360 tablet, Rfl: 2 .  docusate sodium (COLACE) 50 MG capsule, Take 100 mg by mouth 2 (two) times daily. , Disp: , Rfl:  .  ibuprofen (ADVIL,MOTRIN) 600 MG tablet, Take 1 tablet (600 mg total) by mouth every 6 (six) hours., Disp: 30 tablet, Rfl: 1 .  Prenatal Vit-Fe Fumarate-FA (PRENATAL  MULTIVITAMIN) TABS tablet, Take 1 tablet by mouth daily., Disp: , Rfl:  .  sertraline (ZOLOFT) 100 MG tablet, Take 1 tablet (100 mg total) by mouth daily., Disp: 90 tablet, Rfl: 2   Assessment Axis I obsessive-compulsive disorder, ADD by history Axis II deferred Axis III recent infection Axis IV mild Axis V 70-75  Plan  The patient will continue Zoloft 00 mg daily. And Xanax 0.5 mg 4 times a day as needed for anxiety. She'll call if any of her symptoms worsen but otherwise will return in 3 months  Diannia Ruder MD

## 2014-05-13 ENCOUNTER — Ambulatory Visit (HOSPITAL_COMMUNITY): Payer: Self-pay | Admitting: Psychiatry

## 2014-05-17 ENCOUNTER — Ambulatory Visit (HOSPITAL_COMMUNITY): Payer: Self-pay | Admitting: Psychiatry

## 2014-05-30 ENCOUNTER — Ambulatory Visit (INDEPENDENT_AMBULATORY_CARE_PROVIDER_SITE_OTHER): Payer: BLUE CROSS/BLUE SHIELD | Admitting: Psychiatry

## 2014-05-30 DIAGNOSIS — F411 Generalized anxiety disorder: Secondary | ICD-10-CM | POA: Diagnosis not present

## 2014-05-30 NOTE — Patient Instructions (Signed)
Discussed orally 

## 2014-05-30 NOTE — Progress Notes (Signed)
                     THERAPIST PROGRESS NOTE  Session Time: Thursday 05/30/2014 11:20 AM - 11:50 AM  Participation Level: Active  Behavioral Response: CasualAlertAnxious  Type of Therapy: Individual Therapy  Treatment Goals addressed: implement coping skills alleviating symptoms of anxiety (excessive worry, restlessness, and crying spells)         Improve assertiveness skills and ability to set and maintain boundaries  Interventions: CBT and Supportive  Summary: Theresa Jackson is a 23 y.o. female who presents who is referred for services by Dr Loraine Leriche due to patient experiencing symptoms of anxiety and depression. 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 Symptoms have worsened as she has aged. She states being so "paranoid" about her past that it is interfering with her daily functioning. She also experiences poor self- esteem.   Patient reports increased stress since last session due to conflict in her marriage. Per patient's report, husband is not supportive and will not help out with household responsibilities. She also reports he becomes angry, details, and fusses when she asks him. to help out or to help take care of the baby. She also reports trust issues regarding husband as she says he does not tell her the truth about their finances but expects her to manage their grocery experiences on an extremely tight budget. He has told her to go back to work if she wants more money per her report. 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 She states she is fine during the day taking care of the baby.  However, she becomes very anxious and stressed when her husband arrives home as he seems to always be angry. She admits they have communication problems but says her husband will not consider marriage counseling as he states she is the one with the problem. Patient  continues to attend the gym but reports husband complains about taking care of the baby while she is away.   Suicidal/Homicidal: No  Therapist Response: Therapist works with patient to identify and verbalize feelings, identify ways to use members of her support system, identify realistic expectations of interaction with spouse, identify ways to have more time for self, and review relaxation techniques,  Plan: Return again in 3 weeks.  Diagnosis: Axis I: Generalized Anxiety Disorder and Obsessive Compulsive Disorder    Axis II: No diagnosis    BYNUM,PEGGY, LCSW 05/30/2014

## 2014-06-13 ENCOUNTER — Ambulatory Visit (INDEPENDENT_AMBULATORY_CARE_PROVIDER_SITE_OTHER): Payer: BLUE CROSS/BLUE SHIELD | Admitting: Psychiatry

## 2014-06-13 DIAGNOSIS — F411 Generalized anxiety disorder: Secondary | ICD-10-CM

## 2014-06-13 NOTE — Progress Notes (Signed)
                     THERAPIST PROGRESS NOTE  Session Time: Thursday 06/13/2014 2:25 PM - 2:55 PM  Participation Level: Active  Behavioral Response: CasualAlertAnxious  Type of Therapy: Individual Therapy  Treatment Goals addressed: implement coping skills alleviating symptoms of anxiety (excessive worry, restlessness, and crying spells)         Improve assertiveness skills and ability to set and maintain boundaries  Interventions: CBT and Supportive  Summary: Theresa Jackson is a 24 y.o. female who presents who is referred for services by Dr Loraine Leriche due to patient experiencing symptoms of anxiety and depression. 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 Symptoms have worsened as she has aged. She states being so "paranoid" about her past that it is interfering with her daily functioning. She also experiences poor self- esteem.   Patient reports continued stress since last session due to conflict in her marriage. She expresses continued frustration regarding husband's behavior and lack of involvement in sharing household and parenting responsibilities. Patient is very worried about finances and expresses frustration husband plans to use reward points to purchase an X-Box when the family has many other necessities. She admits she doesn't really talk to husband because she doesn't want to hear him complain. She reports no trust in husband as he has a pattern of lying about significant and insignificant issues. She is trying to stay focused on taking care of the baby.    Suicidal/Homicidal: No  Therapist Response: Therapist works with patient to identify and verbalize feelings, identify her actions and patterns may be affecting interaction in her marriage, identify areas within her control   Plan: Return again in 3 weeks.  Diagnosis: Axis I: Generalized Anxiety Disorder and Obsessive Compulsive Disorder    Axis II: No  diagnosis    Demon Volante, LCSW 06/13/2014

## 2014-06-13 NOTE — Patient Instructions (Signed)
Discussed orally 

## 2014-07-08 ENCOUNTER — Ambulatory Visit (INDEPENDENT_AMBULATORY_CARE_PROVIDER_SITE_OTHER): Payer: Medicaid Other | Admitting: Psychiatry

## 2014-07-08 DIAGNOSIS — F411 Generalized anxiety disorder: Secondary | ICD-10-CM | POA: Diagnosis not present

## 2014-07-08 DIAGNOSIS — F42 Obsessive-compulsive disorder: Secondary | ICD-10-CM | POA: Diagnosis not present

## 2014-07-09 NOTE — Patient Instructions (Signed)
Discussed orally 

## 2014-07-09 NOTE — Progress Notes (Signed)
                     THERAPIST PROGRESS NOTE  Session Time: Monday 07/08/2014 11:05 AM - 12:00 PM  Participation Level: Active  Behavioral Response: CasualAlertAnxious  Type of Therapy: Individual Therapy  Treatment Goals addressed: Implement coping skills alleviating symptoms of anxiety (excessive worry, restlessness, and crying spells)         Improve assertiveness skills and ability to set and maintain boundaries  Interventions: CBT and Supportive  Summary: Theresa Jackson is a 24 y.o. female who presents who is referred for services by Dr Loraine Leriche due to patient experiencing symptoms of anxiety and depression. 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 Symptoms have worsened as she has aged. She states being so "paranoid" about her past that it is interfering with her daily functioning. She also experiences poor self- esteem.   Patient reports decreased stress since last session. She states being less stressed while her husband was away for a week in [place]. She reports deciding during that time that she would talk with her husband about her concerns and states beginning to conversation with him on the phone. She continued conversation once he arrived home and was assertive in expressing her thoughts, needs,  and feelings. She reports husband has had a positive response. They went out as a couple this past weekend and patientreports enjoying their time together. She states feeling good about being assertive and reports realizing she has options regarding leaving or remaining in the marriage as well as strong support from her parents and her friends. She also states no longer feeling trapped.   Suicidal/Homicidal: No  Therapist Response: Therapist works with patient to identify and verbalize feelings, praise use of assertiveness skills, identify thought patterns and effects on mood and behavior  Plan: Return again in 3  weeks.  Diagnosis: Axis I: Generalized Anxiety Disorder and Obsessive Compulsive Disorder    Axis II: No diagnosis    Benford Asch, LCSW 07/09/2014

## 2014-08-01 ENCOUNTER — Encounter (HOSPITAL_COMMUNITY): Payer: Self-pay | Admitting: Psychiatry

## 2014-08-01 ENCOUNTER — Ambulatory Visit (INDEPENDENT_AMBULATORY_CARE_PROVIDER_SITE_OTHER): Payer: BLUE CROSS/BLUE SHIELD | Admitting: Psychiatry

## 2014-08-01 VITALS — BP 124/78 | HR 75 | Ht 62.0 in | Wt 118.8 lb

## 2014-08-01 DIAGNOSIS — F909 Attention-deficit hyperactivity disorder, unspecified type: Secondary | ICD-10-CM

## 2014-08-01 DIAGNOSIS — F411 Generalized anxiety disorder: Secondary | ICD-10-CM

## 2014-08-01 DIAGNOSIS — F429 Obsessive-compulsive disorder, unspecified: Secondary | ICD-10-CM

## 2014-08-01 DIAGNOSIS — F42 Obsessive-compulsive disorder: Secondary | ICD-10-CM

## 2014-08-01 MED ORDER — SERTRALINE HCL 100 MG PO TABS
100.0000 mg | ORAL_TABLET | Freq: Every day | ORAL | Status: DC
Start: 2014-08-01 — End: 2014-11-07

## 2014-08-01 MED ORDER — ALPRAZOLAM 0.5 MG PO TABS: 0.5000 mg | ORAL_TABLET | Freq: Four times a day (QID) | ORAL | Status: DC

## 2014-08-01 NOTE — Progress Notes (Signed)
Patient ID: Theresa Jackson, female   DOB: 14-Feb-1991, 24 y.o.   MRN: [id number] Patient ID: Theresa Jackson, female   DOB: Jan 16, 1991, 24 y.o.   MRN: [id number] Patient ID: Theresa Jackson, female   DOB: 07-May-1990, 24 y.o.   MRN: [id number] Patient ID: Theresa Jackson, female   DOB: Feb 01, 1991, 24 y.o.   MRN: [id number] Patient ID: Theresa Jackson, female   DOB: 01-Oct-1990, 24 y.o.   MRN: [id number] Patient ID: Theresa Jackson, female   DOB: February 26, 1991, 24 y.o.   MRN: [id number] Patient ID: Theresa Jackson, female   DOB: [birth year], 24 y.o.   MRN: [id number] Theresa Wintjen 1992  Subjective I am doing  better  History of present illness. Patient is 24 year old Caucasian married employed female who came for her followup appointment  Her job has been stressful. 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 She often has headaches and feels stressed and overwhelmed. She would like to have something to take for the stress. She had tried Xanax once before for tooth extraction and it really helped her feel calm She also has ADD however she does not have any symptoms of attention focus and concentration problems.  She does not feel that she requires medication for ADD.  Her OCD is much better with the medication.  She sleeping better.  She's not drinking or using any illegal substance.  Patient has not seen therapist for more than 2 months.  Patient admitted, she has busy schedule however she will like to followup with a therapist in the future.  The patient returns after 10 months. She got pregnant and stopped all of her medications. Her baby was born on 12/16/2013. She did have some complications with preeclampsia and had to spend 2 days in the ICU. Since the baby has been home she's had a difficult time. Her OCD has kicked in full force. She's constantly checking the baby to make sure he is breathing. She's not able to sleep well.  She's been trying to breast-feed the baby is not latching on and the pumping is really hurting so she's made a decision to stop. He is actually pretty easy-going baby and sleeps  3-4 hours after he has been fed. She denies any thoughts of hurting herself or the baby. She just seems overwhelmed and is starting to get depressed again. She had a good response to Zoloft in the past for depression OCD and she states Xanax has helped in the past for anxiety. I am agreeable to starting both these medications along with her counseling. Stopping the breast-feeding will remove a lot of stress from the situation  The patient returns again after 3 months. She is here with her baby son who is 24 months old. She is doing well except she is obsessing about about her weight. She's down to 118 pounds and still think she is overweight and I told her she is just about right. She also obsesses somewhat about cleaning her house. However her mood is good and she's not had any further panic attacks or depressive symptoms. She is enjoying taking care of her son and is still going to the gym every day  .  Filed Vitals:   08/01/14 1147  BP: 124/78  Pulse: 75   Mental Status Examination Patient is well groomed well dressed female who appears to be in her stated age.  She is much less anxious and her affect is  bright   Her speech is fluent and coherent.  Her thought processes are logical linear and goal-directed.  There were no tremors or shakes.  She denies any active or passive suicidal thoughts or homicidal thoughts.  She denies any auditory or visual hallucination.  There were no paranoia or delusions present at this time.  Her psychomotor activity is normal She's alert and oriented x3.  Her insight judgment and impulse control is okay.  Current Medication  Current outpatient prescriptions:  .  ALPRAZolam (XANAX) 0.5 MG tablet, Take 1 tablet (0.5 mg total) by mouth 4 (four) times daily., Disp: 360 tablet, Rfl: 2 .  docusate  sodium (COLACE) 50 MG capsule, Take 100 mg by mouth 2 (two) times daily. , Disp: , Rfl:  .  ibuprofen (ADVIL,MOTRIN) 600 MG tablet, Take 1 tablet (600 mg total) by mouth every 6 (six) hours., Disp: 30 tablet, Rfl: 1 .  sertraline (ZOLOFT) 100 MG tablet, Take 1 tablet (100 mg total) by mouth daily., Disp: 90 tablet, Rfl: 2   Assessment Axis I obsessive-compulsive disorder, ADD by history Axis II deferred Axis III recent infection Axis IV mild Axis V 70-75  Plan  The patient will continue Zoloft 100 mg daily. And Xanax 0.5 mg 4 times a day as needed for anxiety. She'll call if any of her symptoms worsen but otherwise will return in 3 months  Theresa Ruder MD

## 2014-09-18 ENCOUNTER — Telehealth (HOSPITAL_COMMUNITY): Payer: Self-pay | Admitting: *Deleted

## 2014-09-19 ENCOUNTER — Ambulatory Visit (HOSPITAL_COMMUNITY): Payer: Self-pay | Admitting: Psychiatry

## 2014-10-15 ENCOUNTER — Ambulatory Visit (HOSPITAL_COMMUNITY): Payer: Self-pay | Admitting: Psychiatry

## 2014-10-28 ENCOUNTER — Ambulatory Visit (INDEPENDENT_AMBULATORY_CARE_PROVIDER_SITE_OTHER): Payer: BLUE CROSS/BLUE SHIELD | Admitting: Psychiatry

## 2014-10-28 DIAGNOSIS — F411 Generalized anxiety disorder: Secondary | ICD-10-CM | POA: Diagnosis not present

## 2014-10-28 NOTE — Progress Notes (Signed)
                     THERAPIST PROGRESS NOTE  Session Time: Monday 10/28/2014  Participation Level: Active  Behavioral Response: CasualAlertAnxious  Type of Therapy: Individual Therapy  Treatment Goals addressed: Implement coping skills alleviating symptoms of anxiety (excessive worry, restlessness, and crying spells)         Improve assertiveness skills and ability to set and maintain boundaries  Interventions: CBT and Supportive  Summary: Theresa Jackson is a 24 y.o. female who presents who is referred for services by Dr Loraine Leriche due to patient experiencing symptoms of anxiety and depression. 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 Symptoms have worsened as she has aged. She states being so "paranoid" about her past that it is interfering with her daily functioning. She also experiences poor self- esteem.   Patient reports ioncreased stress since last session. She says she and husband continue to have marital problems. Per patient's report, he spends little quality time with her, often is angry, and is not content with where he is in life. His father has stage 4 cancer and already has told patient he is responsible for his mother and siblings when his father dies. Patient fears how this will affect her family. She also expresses frustration as husband does not give her information about their financial situation and recently gave his parents $4000 without talking with patient. She says she has give husband an ultimatum about their marriage and has urged husband to attend counseling.     Suicidal/Homicidal: No  Therapist Response: Therapist works with patient to identify and verbalize feelings, identify ways to be assertive, discuss possible resources for counseling for husband,   Plan: Return again in 3 weeks.  Diagnosis: Axis I: Generalized Anxiety Disorder and Obsessive Compulsive Disorder    Axis II: No  diagnosis    BYNUM,PEGGY, LCSW 10/28/2014

## 2014-10-28 NOTE — Patient Instructions (Signed)
Discussed orally 

## 2014-10-31 ENCOUNTER — Ambulatory Visit (HOSPITAL_COMMUNITY): Payer: Self-pay | Admitting: Psychiatry

## 2014-11-07 ENCOUNTER — Ambulatory Visit (INDEPENDENT_AMBULATORY_CARE_PROVIDER_SITE_OTHER): Payer: BLUE CROSS/BLUE SHIELD | Admitting: Psychiatry

## 2014-11-07 ENCOUNTER — Encounter (HOSPITAL_COMMUNITY): Payer: Self-pay | Admitting: Psychiatry

## 2014-11-07 VITALS — BP 112/70 | HR 81 | Ht 62.0 in | Wt 118.0 lb

## 2014-11-07 DIAGNOSIS — F909 Attention-deficit hyperactivity disorder, unspecified type: Secondary | ICD-10-CM

## 2014-11-07 DIAGNOSIS — F42 Obsessive-compulsive disorder: Secondary | ICD-10-CM

## 2014-11-07 DIAGNOSIS — F429 Obsessive-compulsive disorder, unspecified: Secondary | ICD-10-CM

## 2014-11-07 MED ORDER — SERTRALINE HCL 100 MG PO TABS
100.0000 mg | ORAL_TABLET | Freq: Every day | ORAL | Status: DC
Start: 2014-11-07 — End: 2015-02-04

## 2014-11-07 MED ORDER — ALPRAZOLAM 0.5 MG PO TABS: 0.5000 mg | ORAL_TABLET | Freq: Four times a day (QID) | ORAL | Status: DC

## 2014-11-07 NOTE — Progress Notes (Signed)
Patient ID: Theresa Jackson, female   DOB: December 07, 1990, 24 y.o.   MRN: [id number] Patient ID: Theresa Jackson, female   DOB: [birth year], 24 y.o.   MRN: [id number] Patient ID: Theresa Jackson, female   DOB: 1990-04-19, 24 y.o.   MRN: [id number] Patient ID: Theresa Jackson, female   DOB: [birth year], 24 y.o.   MRN: [id number] Patient ID: Theresa Jackson, female   DOB: [birth year], 24 y.o.   MRN: [id number] Patient ID: Theresa Jackson, female   DOB: June 01, 1990, 24 y.o.   MRN: [id number] Patient ID: Theresa Jackson, female   DOB: 22-May-1990, 24 y.o.   MRN: [id number] Patient ID: Theresa Jackson, female   DOB: Oct 12, 1990, 24 y.o.   MRN: [id number] Theresa Pete [id number]  Subjective I am doing ok  History of present illness. Patient is 24 year old Caucasian married employed female who came for her followup appointment  Her job has been stressful. 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 She often has headaches and feels stressed and overwhelmed. She would like to have something to take for the stress. She had tried Xanax once before for tooth extraction and it really helped her feel calm She also has ADD however she does not have any symptoms of attention focus and concentration problems.  She does not feel that she requires medication for ADD.  Her OCD is much better with the medication.  She sleeping better.  She's not drinking or using any illegal substance.  Patient has not seen therapist for more than 2 months.  Patient admitted, she has busy schedule however she will like to followup with a therapist in the future.  The patient returns after 10 months. She got pregnant and stopped all of her medications. Her baby was born on 12/16/2013. She did have some complications with preeclampsia and had to spend 2 days in the ICU. Since the baby has been home she's had a difficult time. Her OCD has kicked in full force. She's constantly  checking the baby to make sure he is breathing. She's not able to sleep well. She's been trying to breast-feed the baby is not latching on and the pumping is really hurting so she's made a decision to stop. He is actually pretty easy-going baby and sleeps  3-4 hours after he has been fed. She denies any thoughts of hurting herself or the baby. She just seems overwhelmed and is starting to get depressed again. She had a good response to Zoloft in the past for depression OCD and she states Xanax has helped in the past for anxiety. I am agreeable to starting both these medications along with her counseling. Stopping the breast-feeding will remove a lot of stress from the situation  The patient returns again after 3 months. She is here with her baby son who is 24 months old. She is doing well and denies any symptoms of depression or anxiety. 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  Filed Vitals:   11/07/14 1138  BP: 112/70  Pulse: 81   Mental Status Examination Patient is well groomed well dressed female who appears to be in her stated age.  She is much less anxious and her affect is bright   Her speech is fluent and coherent.  Her thought processes are logical linear and goal-directed.  There were no tremors or shakes.  She denies  any active or passive suicidal thoughts or homicidal thoughts.  She denies any auditory or visual hallucination.  There were no paranoia or delusions present at this time.  Her psychomotor activity is normal She's alert and oriented x3.  Her insight judgment and impulse control is okay.  Current Medication  Current outpatient prescriptions:  .  ALPRAZolam (XANAX) 0.5 MG tablet, Take 1 tablet (0.5 mg total) by mouth 4 (four) times daily., Disp: 360 tablet, Rfl: 2 .  docusate sodium (COLACE) 50 MG capsule, Take 100 mg by mouth 2 (two) times daily. , Disp: , Rfl:  .  ibuprofen (ADVIL,MOTRIN) 600 MG tablet, Take 1 tablet (600 mg  total) by mouth every 6 (six) hours. (Patient taking differently: Take 600 mg by mouth as needed. ), Disp: 30 tablet, Rfl: 1 .  sertraline (ZOLOFT) 100 MG tablet, Take 1 tablet (100 mg total) by mouth daily., Disp: 90 tablet, Rfl: 2   Assessment Axis I obsessive-compulsive disorder, ADD by history Axis II deferred Axis III none Axis IV mild Axis V 70-75  Plan  The patient will continue Zoloft 100 mg daily for depression and anxiety. And Xanax 0.5 mg 4 times a day as needed for anxiety. She'll call if any of her symptoms worsen but otherwise will return in 3 months  Diannia Ruder MD

## 2014-11-18 ENCOUNTER — Telehealth (HOSPITAL_COMMUNITY): Payer: Self-pay | Admitting: *Deleted

## 2014-11-18 ENCOUNTER — Ambulatory Visit (INDEPENDENT_AMBULATORY_CARE_PROVIDER_SITE_OTHER): Payer: BLUE CROSS/BLUE SHIELD | Admitting: Psychiatry

## 2014-11-18 DIAGNOSIS — F411 Generalized anxiety disorder: Secondary | ICD-10-CM | POA: Diagnosis not present

## 2014-11-18 NOTE — Progress Notes (Signed)
                      THERAPIST PROGRESS NOTE  Session Time: Monday 11/18/2014 11:15 AM - 12:03 PM  Participation Level: Active  Behavioral Response: CasualAlertAnxious  Type of Therapy: Individual Therapy  Treatment Goals :    1. Implement coping skills alleviating symptoms of anxiety (excessive worry, restlessness, and crying spells)          2. Learn and implement assertiveness skills and ability to set and maintain boundaries  Treatment Goals addressed: 1  Interventions: CBT and Supportive  Summary: Theresa Jackson is a 24 y.o. female who presents who is referred for services by Dr Loraine Leriche due to patient experiencing symptoms of anxiety and depression. 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 Symptoms have worsened as she has aged. She states being so "paranoid" about her past that it is interfering with her daily functioning. She also experiences poor self- esteem.   Patient reports continued  stress since last session. She says she and husband continue to have marital problems. She expresses frustration that she husband has a pattern of lying per her report. She expresses ambivalent feelings about marriage. She states husband says her worries are in her head and he will not attend counseling per patient's report. She continues to worry constantly about marriage and other issues per patient's report. She continues to struggle with being assertive as she states she does not want to hurt anyone's feelings.   Suicidal/Homicidal: No  Therapist Response: Therapist works with patient to identify and verbalize feelings, review treatment plan, discuss connection between thoughts, mood, and behavior, discuss rationale for anxiety log and ways to use.  Plan: Return again in 3 weeks. Patient agrees to complete anxiety log and bring to next session  Diagnosis: Axis I: Generalized Anxiety Disorder and Obsessive Compulsive  Disorder    Axis II: No diagnosis    Theresa Cowie, LCSW 11/18/2014

## 2014-11-18 NOTE — Patient Instructions (Signed)
Discussed orally 

## 2015-02-04 ENCOUNTER — Encounter (HOSPITAL_COMMUNITY): Payer: Self-pay | Admitting: Psychiatry

## 2015-02-04 ENCOUNTER — Telehealth (HOSPITAL_COMMUNITY): Payer: Self-pay | Admitting: *Deleted

## 2015-02-04 ENCOUNTER — Ambulatory Visit (INDEPENDENT_AMBULATORY_CARE_PROVIDER_SITE_OTHER): Payer: BLUE CROSS/BLUE SHIELD | Admitting: Psychiatry

## 2015-02-04 VITALS — BP 103/68 | HR 105 | Ht 62.0 in | Wt 117.2 lb

## 2015-02-04 DIAGNOSIS — F988 Other specified behavioral and emotional disorders with onset usually occurring in childhood and adolescence: Secondary | ICD-10-CM

## 2015-02-04 DIAGNOSIS — F429 Obsessive-compulsive disorder, unspecified: Secondary | ICD-10-CM | POA: Diagnosis not present

## 2015-02-04 DIAGNOSIS — F411 Generalized anxiety disorder: Secondary | ICD-10-CM

## 2015-02-04 MED ORDER — SERTRALINE HCL 100 MG PO TABS: 100.0000 mg | ORAL_TABLET | Freq: Every day | ORAL | Status: DC

## 2015-02-04 MED ORDER — ALPRAZOLAM 0.5 MG PO TABS: 0.5000 mg | ORAL_TABLET | Freq: Four times a day (QID) | ORAL | Status: DC

## 2015-02-04 NOTE — Progress Notes (Signed)
Patient ID: Theresa Jackson, female   DOB: 1990-04-07, 24 y.o.   MRN: [id number] Patient ID: Theresa Jackson, female   DOB: [birth year], 24 y.o.   MRN: [id number] Patient ID: Theresa Jackson, female   DOB: 1991-01-27, 24 y.o.   MRN: [id number] Patient ID: Theresa Jackson, female   DOB: 02-08-1991, 24 y.o.   MRN: [id number] Patient ID: Theresa Jackson, female   DOB: 11/09/1990, 24 y.o.   MRN: [id number] Patient ID: Theresa Jackson, female   DOB: 1990/08/04, 24 y.o.   MRN: [id number] Patient ID: Theresa Jackson, female   DOB: 11-Mar-1991, 24 y.o.   MRN: [id number] Patient ID: Theresa Jackson, female   DOB: [birth year], 24 y.o.   MRN: [id number] Patient ID: Theresa Jackson, female   DOB: 11/07/1990, 24 y.o.   MRN: [id number] Theresa Kofler [birth year]  Subjective I am doing ok  History of present illness. Patient is 24 year old Caucasian married employed female who came for her followup appointment  Her job has been stressful. 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 She often has headaches and feels stressed and overwhelmed. She would like to have something to take for the stress. She had tried Xanax once before for tooth extraction and it really helped her feel calm She also has ADD however she does not have any symptoms of attention focus and concentration problems.  She does not feel that she requires medication for ADD.  Her OCD is much better with the medication.  She sleeping better.  She's not drinking or using any illegal substance.  Patient has not seen therapist for more than 24 months.  Patient admitted, she has busy schedule however she will like to followup with a therapist in the future.  The patient returns after 3 months. She does well most of the time and sometimes her OCD gets worse when she is stressed. She recently lost a flash drive of family pictures on and she is worried that someone picked it up and she couldn't  stop worrying about it. For the most part however she feels that her medications control her symptoms fairly well. She is here with her 24-month-old son and he is very happy and active and keeps her busy  .  Filed Vitals:   02/04/15 1358  BP: 103/68  Pulse: 105   Mental Status Examination Patient is well groomed well dressed female who appears to be in her stated age.  She is much less anxious and her affect is bright   Her speech is fluent and coherent.  Her thought processes are logical linear and goal-directed.  There were no tremors or shakes.  She denies any active or passive suicidal thoughts or homicidal thoughts.  She denies any auditory or visual hallucination.  There were no paranoia or delusions present at this time.  Her psychomotor activity is normal She's alert and oriented x3.  Her insight judgment and impulse control is okay.  Current Medication  Current outpatient prescriptions:  .  ALPRAZolam (XANAX) 0.5 MG tablet, Take 1 tablet (0.5 mg total) by mouth 4 (four) times daily., Disp: 360 tablet, Rfl: 2 .  ibuprofen (ADVIL,MOTRIN) 600 MG tablet, Take 1 tablet (600 mg total) by mouth every 6 (six) hours. (Patient taking differently: Take 600 mg by mouth as needed. ), Disp: 30 tablet, Rfl: 1 .  sertraline (ZOLOFT) 100 MG tablet, Take 1 tablet (100 mg total) by mouth daily., Disp: 90  tablet, Rfl: 2 .  docusate sodium (COLACE) 50 MG capsule, Take 100 mg by mouth 2 (two) times daily. , Disp: , Rfl:    Assessment Axis I obsessive-compulsive disorder, ADD by history Axis II deferred Axis III none Axis IV mild Axis V 70-75  Plan  The patient will continue Zoloft 100 mg daily for depression and anxiety. And Xanax 0.5 mg 4 times a day as needed for anxiety. She'll call if any of her symptoms worsen but otherwise will return in 3 months  Diannia Ruder MD

## 2015-02-06 ENCOUNTER — Ambulatory Visit (HOSPITAL_COMMUNITY): Payer: Self-pay | Admitting: Psychiatry

## 2015-04-22 ENCOUNTER — Encounter (HOSPITAL_COMMUNITY): Payer: Self-pay | Admitting: Psychiatry

## 2015-04-22 ENCOUNTER — Ambulatory Visit (INDEPENDENT_AMBULATORY_CARE_PROVIDER_SITE_OTHER): Payer: BLUE CROSS/BLUE SHIELD | Admitting: Psychiatry

## 2015-04-22 DIAGNOSIS — F411 Generalized anxiety disorder: Secondary | ICD-10-CM | POA: Diagnosis not present

## 2015-04-22 DIAGNOSIS — F429 Obsessive-compulsive disorder, unspecified: Secondary | ICD-10-CM

## 2015-04-22 NOTE — Patient Instructions (Signed)
Discussed orally 

## 2015-04-22 NOTE — Progress Notes (Signed)
                       THERAPIST PROGRESS NOTE  Session Time:  Tuesday 04/22/2015 10:05 AM -  10:58 AM   Participation Level: Active  Behavioral Response: CasualAlertAnxious  Type of Therapy: Individual Therapy  Treatment Goals :    1. Implement coping skills alleviating symptoms of anxiety (excessive worry, restlessness, and crying spells)          2. Learn and implement assertiveness skills and ability to set and maintain boundaries  Treatment Goals addressed: 1  Interventions: CBT and Supportive  Summary: Theresa Jackson is a 25 y.o. female who presents who is referred for services by Dr Loraine Leriche due to patient experiencing symptoms of anxiety and depression. 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 Symptoms have worsened as she has aged. She states being so "paranoid" about her past that it is interfering with her daily functioning. She also experiences poor self- esteem.   Patient last was seen in August 2016. She reports facing several stressors since then including her father-in-law's death in April 08, 2015. She experienced increased anxiety and reports she felt overwhelmed at times. However, she reports she and husband became closer during that time and continue to have a positive relationship. She has started working on the weekends where her mother works. This has been helpful as patient feels productive and is glad to earn money. She recently resumed attending the gym and reports this has reduced stress. She initially experienced frustration and anxiety regarding her mother-in-law after father-in-law's death due to mother-in-law's behavior. She was  able to be assertive with husband about her concerns and set boundaries in the relationship with mother-in-law/ She reports one incident in which OCD tendencies were triggered when she lost her cell phone. However, she found phone and symptoms have abated.        Suicidal/Homicidal: No  Therapist Response: Therapist works with patient to identify and verbalize feelings, praise and reinforce use of relaxation techniques and improve self-care, praise and reinforce use of assertiveness skills  Plan: Return again in 4 weeks.   Diagnosis: Axis I: Generalized Anxiety Disorder and Obsessive Compulsive Disorder    Axis II: No diagnosis    Babygirl Trager, LCSW 04/22/2015

## 2015-05-07 ENCOUNTER — Ambulatory Visit (INDEPENDENT_AMBULATORY_CARE_PROVIDER_SITE_OTHER): Payer: BLUE CROSS/BLUE SHIELD | Admitting: Psychiatry

## 2015-05-07 ENCOUNTER — Encounter (HOSPITAL_COMMUNITY): Payer: Self-pay | Admitting: Psychiatry

## 2015-05-07 VITALS — BP 107/72 | HR 88 | Ht 62.0 in | Wt 119.0 lb

## 2015-05-07 DIAGNOSIS — F411 Generalized anxiety disorder: Secondary | ICD-10-CM | POA: Diagnosis not present

## 2015-05-07 MED ORDER — ALPRAZOLAM 0.5 MG PO TABS: 0.5000 mg | ORAL_TABLET | Freq: Four times a day (QID) | ORAL | Status: DC

## 2015-05-07 MED ORDER — SERTRALINE HCL 100 MG PO TABS
100.0000 mg | ORAL_TABLET | Freq: Every day | ORAL | Status: DC
Start: 2015-05-07 — End: 2015-08-04

## 2015-05-07 NOTE — Progress Notes (Signed)
Patient ID: KAIYANA HOLTAN, female   DOB: [birth year], 25 y.o.   MRN: [id number] Patient ID: SHIFRA PRICER, female   DOB: 1990-08-15, 25 y.o.   MRN: [id number] Patient ID: JULIENE STELLMAN, female   DOB: [birth year], 25 y.o.   MRN: [id number] Patient ID: KRISLIN CASTONGUAY, female   DOB: [birth year], 25 y.o.   MRN: [id number] Patient ID: KAITLAND LABELLE, female   DOB: Dec 17, 1990, 25 y.o.   MRN: [id number] Patient ID: RADLEY ABRAMSON, female   DOB: 11/18/1990, 25 y.o.   MRN: [id number] Patient ID: BERTHENA GALECKI, female   DOB: 04/07/1990, 25 y.o.   MRN: [id number] Patient ID: ROSCHELL HERMON, female   DOB: Nov 25, 1990, 25 y.o.   MRN: [id number] Patient ID: HEAVENLEIGH ELEFANTE, female   DOB: Feb 06, 1991, 25 y.o.   MRN: [id number] Patient ID: GRACYNN WONDERLY, female   DOB: 1990/09/13, 25 y.o.   MRN: [id number] Jomanda Bowley [birth year]  Subjective I am doing ok  History of present illness. Patient is 25 year old Caucasian married employed female who came for her followup appointment  Her job has been stressful. 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 She often has headaches and feels stressed and overwhelmed. She would like to have something to take for the stress. She had tried Xanax once before for tooth extraction and it really helped her feel calm She also has ADD however she does not have any symptoms of attention focus and concentration problems.  She does not feel that she requires medication for ADD.  Her OCD is much better with the medication.  She sleeping better.  She's not drinking or using any illegal substance.  Patient has not seen therapist for more than 2 months.  Patient admitted, she has busy schedule however she will like to followup with a therapist in the future.  The patient returns after 3 months. She does well most of the time. She states that her father-in-law died of cancer right before Christmas and this is  been hard on the whole family. She and her husband have had to do a lot to help her mother-in-law. She's taking care of her 8-month-old full-time and now she is working a little bit at [employer]. Her mood is generally been good and she is managing her anxiety without difficulty. She is sleeping well  .  Filed Vitals:   05/07/15 1306  BP: 107/72  Pulse: 88   Mental Status Examination Patient is well groomed well dressed female who appears to be in her stated age.  She is much less anxious and her affect is bright   Her speech is fluent and coherent.  Her thought processes are logical linear and goal-directed.  There were no tremors or shakes.  She denies any active or passive suicidal thoughts or homicidal thoughts.  She denies any auditory or visual hallucination.  There were no paranoia or delusions present at this time.  Her psychomotor activity is normal She's alert and oriented x3.  Her insight judgment and impulse control is okay.  Current Medication  Current outpatient prescriptions:  .  ALPRAZolam (XANAX) 0.5 MG tablet, Take 1 tablet (0.5 mg total) by mouth 4 (four) times daily., Disp: 360 tablet, Rfl: 2 .  ibuprofen (ADVIL,MOTRIN) 600 MG tablet, Take 1 tablet (600 mg total) by mouth every 6 (six) hours. (Patient taking differently: Take 600 mg by mouth as needed. ), Disp: 30 tablet, Rfl: 1 .  sertraline (ZOLOFT) 100 MG tablet, Take 1 tablet (100 mg total) by mouth daily., Disp: 90 tablet, Rfl: 2   Assessment Axis I obsessive-compulsive disorder, ADD by history Axis II deferred Axis III none Axis IV mild Axis V 70-75  Plan  The patient will continue Zoloft 100 mg daily for depression and anxiety. And Xanax 0.5 mg 4 times a day as needed for anxiety. She'll call if any of her symptoms worsen but otherwise will return in 3 months  Diannia Ruder MD

## 2015-05-20 ENCOUNTER — Ambulatory Visit (HOSPITAL_COMMUNITY): Payer: Self-pay | Admitting: Psychiatry

## 2015-05-22 ENCOUNTER — Encounter: Payer: Self-pay | Admitting: Family Medicine

## 2015-05-22 ENCOUNTER — Encounter (INDEPENDENT_AMBULATORY_CARE_PROVIDER_SITE_OTHER): Payer: Self-pay

## 2015-05-22 ENCOUNTER — Ambulatory Visit (INDEPENDENT_AMBULATORY_CARE_PROVIDER_SITE_OTHER): Payer: BLUE CROSS/BLUE SHIELD | Admitting: Family Medicine

## 2015-05-22 VITALS — BP 128/78 | HR 118 | Temp 97.4°F | Ht 62.0 in | Wt 118.4 lb

## 2015-05-22 DIAGNOSIS — H6593 Unspecified nonsuppurative otitis media, bilateral: Secondary | ICD-10-CM

## 2015-05-22 MED ORDER — AMOXICILLIN 500 MG PO CAPS: 500.0000 mg | ORAL_CAPSULE | Freq: Three times a day (TID) | ORAL | Status: DC

## 2015-05-22 NOTE — Progress Notes (Signed)
   Subjective:    Patient ID: Theresa Jackson, female    DOB: 06/26/1990, 25 y.o.   MRN: [id number]  HPI 25 year old with bilateral earaches. She tells me that she had the flu last week and was congested and now her ears feel stopped up with some pain. As a youngster she had 4 sets of tubes in her ears and lots of ear problems  Patient Active Problem List   Diagnosis Date Noted  . Indication for care in labor or delivery 12/16/2013  . Otitis media 09/11/2012  . Sore throat 09/11/2012  . ADHD (attention deficit hyperactivity disorder) 06/16/2012  . OCD (obsessive compulsive disorder) 06/16/2012  . Generalized anxiety disorder 06/16/2012  . Family history of benign essential tremor 06/16/2012   Outpatient Encounter Prescriptions as of 05/22/2015  Medication Sig  . ALPRAZolam (XANAX) 0.5 MG tablet Take 1 tablet (0.5 mg total) by mouth 4 (four) times daily.  . sertraline (ZOLOFT) 100 MG tablet Take 1 tablet (100 mg total) by mouth daily.  Marland Kitchen ibuprofen (ADVIL,MOTRIN) 600 MG tablet Take 1 tablet (600 mg total) by mouth every 6 (six) hours. (Patient not taking: Reported on 05/22/2015)   No facility-administered encounter medications on file as of 05/22/2015.      Review of Systems  Constitutional: Negative.   HENT: Positive for ear pain.   Respiratory: Negative.   Cardiovascular: Negative.        Objective:   Physical Exam  Constitutional: She appears well-developed and well-nourished.  HENT:  Mouth/Throat: Oropharynx is clear and moist.  Both tympanic membranes are dull and gray  Pulmonary/Chest: Effort normal and breath sounds normal.          Assessment & Plan:  1. Bilateral non-suppurative otitis media, recurrence not specified Cannot say eardrums are infected but if there is no effusion with pain I think it's likely to be an infection in the near future. Given her history in the past will go ahead and treat with amoxicillin 500 3 times a day for 10 days. Suggested ear  exercises in either Flonase or Afrin to help open eustachian tubes

## 2015-06-03 ENCOUNTER — Telehealth (HOSPITAL_COMMUNITY): Payer: Self-pay | Admitting: *Deleted

## 2015-06-04 ENCOUNTER — Ambulatory Visit (INDEPENDENT_AMBULATORY_CARE_PROVIDER_SITE_OTHER): Payer: BLUE CROSS/BLUE SHIELD | Admitting: Psychiatry

## 2015-06-04 ENCOUNTER — Encounter (HOSPITAL_COMMUNITY): Payer: Self-pay | Admitting: Psychiatry

## 2015-06-04 DIAGNOSIS — F429 Obsessive-compulsive disorder, unspecified: Secondary | ICD-10-CM | POA: Diagnosis not present

## 2015-06-04 DIAGNOSIS — F411 Generalized anxiety disorder: Secondary | ICD-10-CM | POA: Diagnosis not present

## 2015-06-04 NOTE — Progress Notes (Signed)
                        THERAPIST PROGRESS NOTE  Session Time:   Wednesday 06/04/2015 10:08 AM - 11:05 AM  Participation Level: Active  Behavioral Response: CasualAlertAnxious  Type of Therapy: Individual Therapy  Treatment Goals :    1. Learn and implement a strategy to delay worry time          2. Identify, challenge, and replace biased, fearful, self-talk with positive, realistic, empowering self-talk  Treatment Goals addressed: 1,2  Interventions: CBT and Supportive  Summary: Theresa Jackson is a 25 y.o. female who presents who is referred for services by Dr Loraine Leriche due to patient experiencing symptoms of anxiety and depression. 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 Symptoms have worsened as she has aged. She states being so "paranoid" about her past that it is interfering with her daily functioning. She also experiences poor self- esteem.   Patient last was seen inn January 2017. She says things have been okay. However, she reports becoming obsessed with her weight since she gained two pounds. Now she is constantly worried about her weight and fears becoming overweight. She also reports stress related to gastrointestinal issues. She fears she may be a diabetic but reports recent tests indicate normal glucose lever. She reports continuing to worry about a variety of other issues and expresses helplessness and hopelessness about managing worry. She continues to work and reports still enjoying this.     Suicidal/Homicidal: No  Therapist Response: Therapist works with patient to identify and verbalize feelings, explore underlying conflict beneath anxiety, review and revise treatment plan, discuss importance of regular attendance in treatment  Plan: Return again in 2 weeks.   Diagnosis: Axis I: Generalized Anxiety Disorder and Obsessive Compulsive Disorder    Axis II: No diagnosis    BYNUM,PEGGY,  LCSW 06/04/2015

## 2015-06-04 NOTE — Patient Instructions (Signed)
Discussed orally 

## 2015-06-05 ENCOUNTER — Ambulatory Visit (HOSPITAL_COMMUNITY): Payer: Self-pay | Admitting: Psychiatry

## 2015-07-02 ENCOUNTER — Ambulatory Visit (HOSPITAL_COMMUNITY): Payer: Self-pay | Admitting: Psychiatry

## 2015-07-17 ENCOUNTER — Ambulatory Visit (INDEPENDENT_AMBULATORY_CARE_PROVIDER_SITE_OTHER): Payer: BLUE CROSS/BLUE SHIELD | Admitting: Psychiatry

## 2015-07-17 ENCOUNTER — Encounter (HOSPITAL_COMMUNITY): Payer: Self-pay | Admitting: Psychiatry

## 2015-07-17 DIAGNOSIS — F411 Generalized anxiety disorder: Secondary | ICD-10-CM | POA: Diagnosis not present

## 2015-07-17 NOTE — Progress Notes (Signed)
                        THERAPIST PROGRESS NOTE  Session Time:   Thursday 07/17/2015 2:12 PM -   3:00 PM         Participation Level: Active  Behavioral Response: CasualAlertAnxious  Type of Therapy: Individual Therapy  Treatment Goals :    1. Learn and implement a strategy to delay worry time          2. Identify, challenge, and replace biased, fearful, self-talk with positive, realistic, empowering self-talk  Treatment Goals addressed: 1,2  Interventions: CBT and Supportive  Summary: Theresa Jackson is a 25 y.o. female who presents who is referred for services by Dr Loraine Leriche due to patient experiencing symptoms of anxiety and depression. 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 Symptoms have worsened as she has aged. She states being so "paranoid" about her past that it is interfering with her daily functioning. She also experiences poor self- esteem.   Patient reports feeling better since last session. She is pleased things are going well in her marriage. She expresses concerns about her friend but is trying to limit her worry time. She reports increased obsessive compulsive behaviors including weighing self every day. She continues tt to work and reports still enjoying this.      Suicidal/Homicidal: No  Therapist Response: Reviewed symptoms, facilitated expression of feelings, assisted patient with problem-solving skills regarding her friend, provided psychoeducation regarding OCD and began to identify specific obsessions  Plan: Return again in 2 weeks. Patient agrees to read handout on OCD and complete OCD checklist  Diagnosis: Axis I: Generalized Anxiety Disorder and Obsessive Compulsive Disorder    Axis II: No diagnosis    BYNUM,PEGGY, LCSW 07/17/2015

## 2015-07-17 NOTE — Patient Instructions (Signed)
Discussed orally 

## 2015-07-19 ENCOUNTER — Encounter (HOSPITAL_COMMUNITY): Payer: Self-pay | Admitting: *Deleted

## 2015-07-19 ENCOUNTER — Inpatient Hospital Stay (HOSPITAL_COMMUNITY)
Admission: AD | Admit: 2015-07-19 | Discharge: 2015-07-19 | Disposition: A | Discharge status: Home / Self Care | Payer: BLUE CROSS/BLUE SHIELD | Source: Ambulatory Visit | Attending: Obstetrics and Gynecology | Admitting: Obstetrics and Gynecology

## 2015-07-19 ENCOUNTER — Inpatient Hospital Stay (HOSPITAL_COMMUNITY): Payer: BLUE CROSS/BLUE SHIELD

## 2015-07-19 DIAGNOSIS — F419 Anxiety disorder, unspecified: Secondary | ICD-10-CM | POA: Insufficient documentation

## 2015-07-19 DIAGNOSIS — Z79899 Other long term (current) drug therapy: Secondary | ICD-10-CM | POA: Diagnosis not present

## 2015-07-19 DIAGNOSIS — F909 Attention-deficit hyperactivity disorder, unspecified type: Secondary | ICD-10-CM | POA: Diagnosis not present

## 2015-07-19 DIAGNOSIS — Z975 Presence of (intrauterine) contraceptive device: Secondary | ICD-10-CM | POA: Diagnosis not present

## 2015-07-19 DIAGNOSIS — Z818 Family history of other mental and behavioral disorders: Secondary | ICD-10-CM | POA: Insufficient documentation

## 2015-07-19 DIAGNOSIS — Z9889 Other specified postprocedural states: Secondary | ICD-10-CM | POA: Insufficient documentation

## 2015-07-19 DIAGNOSIS — Z811 Family history of alcohol abuse and dependence: Secondary | ICD-10-CM | POA: Diagnosis not present

## 2015-07-19 DIAGNOSIS — R102 Pelvic and perineal pain: Secondary | ICD-10-CM | POA: Diagnosis not present

## 2015-07-19 DIAGNOSIS — F429 Obsessive-compulsive disorder, unspecified: Secondary | ICD-10-CM | POA: Insufficient documentation

## 2015-07-19 DIAGNOSIS — R109 Unspecified abdominal pain: Secondary | ICD-10-CM | POA: Diagnosis present

## 2015-07-19 DIAGNOSIS — Z30431 Encounter for routine checking of intrauterine contraceptive device: Secondary | ICD-10-CM

## 2015-07-19 DIAGNOSIS — Z820 Family history of epilepsy and other diseases of the nervous system: Secondary | ICD-10-CM | POA: Diagnosis not present

## 2015-07-19 LAB — URINALYSIS, ROUTINE W REFLEX MICROSCOPIC
Bilirubin Urine: NEGATIVE
GLUCOSE, UA: NEGATIVE mg/dL
Hgb urine dipstick: NEGATIVE
Ketones, ur: NEGATIVE mg/dL
Leukocytes, UA: NEGATIVE
Nitrite: NEGATIVE
Protein, ur: NEGATIVE mg/dL
pH: 5.5 (ref 5.0–8.0)

## 2015-07-19 LAB — POCT PREGNANCY, URINE: Preg Test, Ur: NEGATIVE

## 2015-07-19 NOTE — MAU Note (Signed)
Awoke last night with a lot of rectal pain. Pain eventually stopped. Today having a lot of pelvic pain and vaginal pain. Alittle better now. Spouse checked for IUD strings and pt stings were longer than normal. More vaginal d/c than usual. [place]age ches[place]-like to now [employer]. Some itching few days ago but went away

## 2015-07-19 NOTE — MAU Provider Note (Signed)
History     CSN: [id number]  Arrival date and time: 07/19/15 2053   First Provider Initiated Contact with Patient 07/19/15 2137      Chief Complaint  Patient presents with  . Abdominal Pain   HPI   Theresa Jackson is a 25 y.o. female G1P1001 at Unknown presenting to MAU with concerns about her IUD. She has had rectal pressure and pelvic pain within the last 24 hours which is new pain for her.  Her partner told her that he felt her strings are longer than normal. She does not feel a pinching sensation in her vaginal area.  The pain was located all throughout her lower abdomen.   No abnormal bleeding.  She currently rates her pain 0/10; she took some ibuprofen around 6:00 pm.   OB History    Gravida Para Term Preterm AB TAB SAB Ectopic Multiple Living   1 1 1       1       Past Medical History  Diagnosis Date  . ADHD (attention deficit hyperactivity disorder)   . Obsessive-compulsive disorder   . Seizures (HCC)   . Anxiety   . [id number](478.2)     Past Surgical History  Procedure Laterality Date  . Tonsillectomy    . Tubes in ears    . Tooth extraction      Family History  Problem Relation Age of Onset  . Depression Maternal Grandfather   . Anxiety disorder Mother   . ADD / ADHD Brother   . ADD / ADHD Maternal Aunt   . Alcohol abuse Maternal Grandmother   . Bipolar disorder Maternal Grandmother   . Dementia Paternal Grandmother   . Hip fracture Paternal Grandmother   . Seizures Cousin   . Drug abuse Neg Hx   . OCD Neg Hx   . Paranoid behavior Neg Hx   . Schizophrenia Neg Hx   . Sexual abuse Neg Hx   . Physical abuse Neg Hx   . ADD / ADHD Maternal Aunt     Social History  Substance Use Topics  . Smoking status: Never Smoker   . Smokeless tobacco: Never Used  . Alcohol Use: 0.6 oz/week    1 Cans of beer per week     Comment: i bottle of beer once per week    Allergies: No Known Allergies  Prescriptions prior to admission  Medication Sig  Dispense Refill Last Dose  . ALPRAZolam (XANAX) 0.5 MG tablet Take 1 tablet (0.5 mg total) by mouth 4 (four) times daily. (Patient taking differently: Take 0.5 mg by mouth 2 (two) times daily as needed. ) 360 tablet 2 07/19/2015 at Unknown time  . sertraline (ZOLOFT) 100 MG tablet Take 1 tablet (100 mg total) by mouth daily. 90 tablet 2 07/19/2015 at Unknown time  . amoxicillin (AMOXIL) 500 MG capsule Take 1 capsule (500 mg total) by mouth 3 (three) times daily. 30 capsule 0   . ibuprofen (ADVIL,MOTRIN) 600 MG tablet Take 1 tablet (600 mg total) by mouth every 6 (six) hours. (Patient not taking: Reported on 05/22/2015) 30 tablet 1 Not Taking   Results for orders placed or performed during the hospital encounter of 07/19/15 (from the past 48 hour(s))  Urinalysis, Routine w reflex microscopic (not at [facility])     Status: Abnormal   Collection Time: 07/19/15  9:10 PM  Result Value Ref Range   Color, Urine YELLOW YELLOW   APPearance CLEAR CLEAR   Specific Gravity, Urine <1.005 (L) 1.005 -  1.030   pH 5.5 5.0 - 8.0   Glucose, UA NEGATIVE NEGATIVE mg/dL   Hgb urine dipstick NEGATIVE NEGATIVE   Bilirubin Urine NEGATIVE NEGATIVE   Ketones, ur NEGATIVE NEGATIVE mg/dL   Protein, ur NEGATIVE NEGATIVE mg/dL   Nitrite NEGATIVE NEGATIVE   Leukocytes, UA NEGATIVE NEGATIVE    Comment: MICROSCOPIC NOT DONE ON URINES WITH NEGATIVE PROTEIN, BLOOD, LEUKOCYTES, NITRITE, OR GLUCOSE <1000 mg/dL.  Pregnancy, urine POC     Status: None   Collection Time: 07/19/15  9:17 PM  Result Value Ref Range   Preg Test, Ur NEGATIVE NEGATIVE    Comment:        THE SENSITIVITY OF THIS METHODOLOGY IS >24 mIU/mL    US Transvaginal Non-ob  07/19/2015  CLINICAL DATA:  Sharp pelvic pain, onset last night. EXAM: TRANSABDOMINAL AND TRANSVAGINAL ULTRASOUND OF PELVIS TECHNIQUE: Both transabdominal and transvaginal ultrasound examinations of the pelvis were performed. Transabdominal technique was performed for global imaging of the pelvis  including uterus, ovaries, adnexal regions, and pelvic cul-de-sac. It was necessary to proceed with endovaginal exam following the transabdominal exam to visualize the endometrium. COMPARISON:  None FINDINGS: Uterus Measurements: 5.6 x 3.6 x 4.2 cm. No fibroids or other mass visualized. IUD appears appropriately positioned. Endometrium Thickness: 5.6 mm.  No focal abnormality visualized. Right ovary Measurements: 2.7 x 2.0 x 2.3 cm. Normal appearance/no adnexal mass. Left ovary Measurements: 1.9 x 1.6 x 1.8 cm. Normal appearance/no adnexal mass. Other findings Moderate volume free pelvic fluid. IMPRESSION: Satisfactorily positioned IUD. Normal uterus and ovaries. Moderate volume free pelvic fluid. Electronically Signed   By: Ellery Plunk M.D.   On: 07/19/2015 22:40   US Pelvis Complete  07/19/2015  CLINICAL DATA:  Sharp pelvic pain, onset last night. EXAM: TRANSABDOMINAL AND TRANSVAGINAL ULTRASOUND OF PELVIS TECHNIQUE: Both transabdominal and transvaginal ultrasound examinations of the pelvis were performed. Transabdominal technique was performed for global imaging of the pelvis including uterus, ovaries, adnexal regions, and pelvic cul-de-sac. It was necessary to proceed with endovaginal exam following the transabdominal exam to visualize the endometrium. COMPARISON:  None FINDINGS: Uterus Measurements: 5.6 x 3.6 x 4.2 cm. No fibroids or other mass visualized. IUD appears appropriately positioned. Endometrium Thickness: 5.6 mm.  No focal abnormality visualized. Right ovary Measurements: 2.7 x 2.0 x 2.3 cm. Normal appearance/no adnexal mass. Left ovary Measurements: 1.9 x 1.6 x 1.8 cm. Normal appearance/no adnexal mass. Other findings Moderate volume free pelvic fluid. IMPRESSION: Satisfactorily positioned IUD. Normal uterus and ovaries. Moderate volume free pelvic fluid. Electronically Signed   By: Ellery Plunk M.D.   On: 07/19/2015 22:40    Review of Systems  Constitutional: Negative for fever and  chills.  Genitourinary: Negative for dysuria.   Physical Exam   Resp. rate 18, height 5' 2.5" (1.588 m), weight 119 lb 12.8 oz (54.341 kg), last menstrual period 07/01/2015, not currently breastfeeding.  Physical Exam  Constitutional: She is oriented to person, place, and time. She appears well-developed and well-nourished. No distress.  HENT:  Head: Normocephalic.  Eyes: Pupils are equal, round, and reactive to light.  Respiratory: Effort normal.  GI: Soft. She exhibits no distension. There is no tenderness. There is no rebound and no guarding.  Genitourinary:  Speculum exam: Vagina - Small amount of creamy discharge, no odor, IUD strings visualized in the cervix. IUD appears to be in appropriate position.  Cervix - No contact bleeding Bimanual exam: Cervix closed, No CMT  Uterus non tender, normal size Adnexa non tender, no masses bilaterally  Chaperone present for exam.  Musculoskeletal: Normal range of motion.  Neurological: She is alert and oriented to person, place, and time.  Skin: Skin is warm. She is not diaphoretic.  Psychiatric: Her behavior is normal.    MAU Course  Procedures  None   MDM  Pelvic US limited   Patient was "informed by radiology tech that IUD was fine, patient was under the impression that she could leave". I called the patient on her cell phone and discussed US findings with her. The patient apologized for leaving, stating that she was told everything was fine and thought that meant she could go. Patient instructed to continue taking ibuprofen as directed on the bottle, and to call GYN office on Monday if needed.    Discussed patient with Dr. Vincente Poli.   Assessment and Plan    A:  1. IUD (intrauterine device) in place   2. IUD check up   3. Pelvic pain in female     P:  Discharge home in stable condition Ok to use ibuprofen, as directed on the bottle Follow up with GYN as needed, if symptoms worsen   Duane Lope,  NP 07/19/2015 11:36 PM

## 2015-07-19 NOTE — MAU Note (Signed)
Pt returned from u/s and thought she could leave. Blanche East NP called pt and discussed u/s results with pt

## 2015-08-04 ENCOUNTER — Encounter (HOSPITAL_COMMUNITY): Payer: Self-pay | Admitting: Psychiatry

## 2015-08-04 ENCOUNTER — Ambulatory Visit (INDEPENDENT_AMBULATORY_CARE_PROVIDER_SITE_OTHER): Payer: BLUE CROSS/BLUE SHIELD | Admitting: Psychiatry

## 2015-08-04 VITALS — BP 103/71 | HR 75 | Ht 62.5 in | Wt 118.0 lb

## 2015-08-04 DIAGNOSIS — F411 Generalized anxiety disorder: Secondary | ICD-10-CM | POA: Diagnosis not present

## 2015-08-04 MED ORDER — SERTRALINE HCL 100 MG PO TABS: 100.0000 mg | ORAL_TABLET | Freq: Every day | ORAL | Status: DC

## 2015-08-04 MED ORDER — ALPRAZOLAM 0.5 MG PO TABS: 0.5000 mg | ORAL_TABLET | Freq: Four times a day (QID) | ORAL | Status: DC

## 2015-08-04 NOTE — Progress Notes (Signed)
Patient ID: LAURIEANN FRIDDLE, female   DOB: Dec 24, 1990, 25 y.o.   MRN: [id number] Patient ID: CAMESHIA CRESSMAN, female   DOB: May 15, 1990, 25 y.o.   MRN: [id number] Patient ID: KAELYNNE CHRISTLEY, female   DOB: 22-Jun-1990, 25 y.o.   MRN: [id number] Patient ID: LAZETTE ESTALA, female   DOB: 12/16/1990, 25 y.o.   MRN: [id number] Patient ID: BRINKLEY PEET, female   DOB: June 25, 1990, 25 y.o.   MRN: [id number] Patient ID: RODERICK SWEEZY, female   DOB: Jul 27, 1990, 25 y.o.   MRN: [id number] Patient ID: RAYOLA EVERHART, female   DOB: [birth year], 25 y.o.   MRN: [id number] Patient ID: GRACIE GUPTA, female   DOB: Aug 29, 1990, 25 y.o.   MRN: [id number] Patient ID: PARVEEN FREEHLING, female   DOB: [birth year], 25 y.o.   MRN: [id number] Patient ID: ROSARIO DUEY, female   DOB: 03-11-1991, 25 y.o.   MRN: [id number] Patient ID: ALIANNY TOELLE, female   DOB: April 18, 1990, 25 y.o.   MRN: [id number] Ailsa Mireles MadrigalMay [birth year], [id number]  Subjective I am doing ok  History of present illness. Patient is 25 year old Caucasian married employed female who came for her followup appointment  Her job has been stressful. 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 She often has headaches and feels stressed and overwhelmed. She would like to have something to take for the stress. She had tried Xanax once before for tooth extraction and it really helped her feel calm She also has ADD however she does not have any symptoms of attention focus and concentration problems.  She does not feel that she requires medication for ADD.  Her OCD is much better with the medication.  She sleeping better.  She's not drinking or using any illegal substance.  Patient has not seen therapist for more than 2 months.  Patient admitted, she has busy schedule however she will like to followup with a therapist in the future.  The patient returns after 3 months. She does well most of the time. Her  son is 52 months old and she watches a 68-month-old baby as well. This is keeping her very busy. She worries at times that something might happen to her son but tries not to think about it. The Xanax helps when she feels very anxious. She is sleeping well and her energy is good  .  Filed Vitals:   08/04/15 1110  BP: 103/71  Pulse: 75   Mental Status Examination Patient is well groomed well dressed female who appears to be in her stated age.  She is much less anxious and her affect is bright   Her speech is fluent and coherent.  Her thought processes are logical linear and goal-directed.  There were no tremors or shakes.  She denies any active or passive suicidal thoughts or homicidal thoughts.  She denies any auditory or visual hallucination.  There were no paranoia or delusions present at this time.  Her psychomotor activity is normal She's alert and oriented x3.  Her insight judgment and impulse control is okay.  Current Medication  Current outpatient prescriptions:  .  ALPRAZolam (XANAX) 0.5 MG tablet, Take 1 tablet (0.5 mg total) by mouth 4 (four) times daily., Disp: 360 tablet, Rfl: 2 .  ibuprofen (ADVIL,MOTRIN) 600 MG tablet, Take 1 tablet (600 mg total) by mouth every 6 (six) hours., Disp: 30 tablet, Rfl: 1 .  polyethylene glycol (MIRALAX / GLYCOLAX) packet, Take 17 g  by mouth daily., Disp: , Rfl:  .  sertraline (ZOLOFT) 100 MG tablet, Take 1 tablet (100 mg total) by mouth daily., Disp: 90 tablet, Rfl: 2   Assessment Axis I obsessive-compulsive disorder, ADD by history Axis II deferred Axis III none Axis IV mild Axis V 70-75  Plan  The patient will continue Zoloft 100 mg daily for depression and anxiety. And Xanax 0.5 mg 4 times a day as needed for anxiety. She'll call if any of her symptoms worsen but otherwise will return in 3 months  Diannia Ruder MD

## 2015-08-08 ENCOUNTER — Ambulatory Visit (HOSPITAL_COMMUNITY): Payer: Self-pay | Admitting: Psychiatry

## 2015-08-11 ENCOUNTER — Ambulatory Visit (INDEPENDENT_AMBULATORY_CARE_PROVIDER_SITE_OTHER): Payer: BLUE CROSS/BLUE SHIELD | Admitting: Psychiatry

## 2015-08-11 ENCOUNTER — Encounter (HOSPITAL_COMMUNITY): Payer: Self-pay | Admitting: Psychiatry

## 2015-08-11 DIAGNOSIS — F429 Obsessive-compulsive disorder, unspecified: Secondary | ICD-10-CM | POA: Diagnosis not present

## 2015-08-11 DIAGNOSIS — F411 Generalized anxiety disorder: Secondary | ICD-10-CM | POA: Diagnosis not present

## 2015-08-11 NOTE — Patient Instructions (Signed)
Discussed orally 

## 2015-08-11 NOTE — Progress Notes (Signed)
                         THERAPIST PROGRESS NOTE  Session Time:   Monday 08/11/2015 2:10 PM - 3:00 PM        Participation Level: Active  Behavioral Response: CasualAlertAnxious  Type of Therapy: Individual Therapy  Treatment Goals :    1. Learn and implement a strategy to delay worry time          2. Identify, challenge, and replace biased, fearful, self-talk with positive, realistic, empowering self-talk  Treatment Goals addressed: ,2  Interventions: CBT and Supportive  Summary: Theresa Jackson is a 25 y.o. female who presents who is referred for services by Dr Loraine Leriche due to patient experiencing symptoms of anxiety and depression. 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 Symptoms have worsened as she has aged. She states being so "paranoid" about her past that it is interfering with her daily functioning. She also experiences poor self- esteem.   Patient reports continued stress since last session. She attributes most of this to the relationship with her husband. Per patient's report, husband does not acknowledge orrecognize all the work that she does but complains about what she does do and when she takes a break for self per patient's report. She admits continuing to seek husband's approval and being anxious about interaction and their marriage. She reports obsessions compulsions worsen when she is upset about her marriage.    Suicidal/Homicidal: No  Therapist Response: Reviewed symptoms, facilitated expression of feelings, assisted patient examined her history to identify events and influences that may have contributed to patient's current symptoms of OCD, assisted patient identify her  thought patterns regarding interaction with her husband and effects on patient's current functioning in the relationship   Plan: Return again in 2 weeks.   Diagnosis: Axis I: Generalized Anxiety Disorder and Obsessive Compulsive  Disorder    Axis II: No diagnosis    Virginio Isidore, LCSW 08/11/2015

## 2015-08-27 ENCOUNTER — Ambulatory Visit (INDEPENDENT_AMBULATORY_CARE_PROVIDER_SITE_OTHER): Payer: BLUE CROSS/BLUE SHIELD | Admitting: Psychiatry

## 2015-08-27 DIAGNOSIS — F411 Generalized anxiety disorder: Secondary | ICD-10-CM

## 2015-08-27 DIAGNOSIS — F429 Obsessive-compulsive disorder, unspecified: Secondary | ICD-10-CM | POA: Diagnosis not present

## 2015-08-27 NOTE — Progress Notes (Signed)
                          THERAPIST PROGRESS NOTE  Session Time:   Wednesday 08/27/2015  1:05 PM - 2:00 PM     Participation Level: Active  Behavioral Response: CasualAlertAnxious  Type of Therapy: Individual Therapy  Treatment Goals :    1. Learn and implement a strategy to delay worry time          2. Identify, challenge, and replace biased, fearful, self-talk with positive, realistic, empowering self-talk  Treatment Goals addressed: ,2  Interventions: CBT and Supportive  Summary: Theresa Jackson is a 25 y.o. female who presents who is referred for services by Dr Loraine Leriche due to patient experiencing symptoms of anxiety and depression. 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 Symptoms have worsened as she has aged. She states being so "paranoid" about her past that it is interfering with her daily functioning. She also experiences poor self- esteem.   Patient reports having mood swings since last session. Per her report, she became extremely depressed on Mother's Day with no provocation. She also reports fluctuating between periods of happiness, irritability,  and sadness within a day for over the past 1 1/2 weeks. She says this has happened in the past and she will eventually return to a normal mood. She expresses concern she may have bipolar disorder. She reports being compliant with medication but has concerns it may not be as effective as it has been in the past. Patient also reports increased anxiety and symptoms of OCD  Suicidal/Homicidal: No  Therapist Response: Reviewed symptoms, facilitated expression of feelings, administered mood disorder screener and discussed patient's responses, discussed scheduling earlier appointment with psychiatrist for medication evaluation, discussed with patient ways to keep daily log of mood and stressors, discussed effects of nutrition on emotional health and explored ways to  improve self-care regarding eating patterns   Plan: Return again in 2 weeks. Patient agrees to keep daily mood log, schedule earlier appointment with psychiatrist, and implement steps to improve eating patterns  Diagnosis: Axis I: Generalized Anxiety Disorder and Obsessive Compulsive Disorder    Axis II: No diagnosis    Charlee Squibb, LCSW 08/27/2015

## 2015-08-27 NOTE — Patient Instructions (Signed)
Discussed orally 

## 2015-11-04 ENCOUNTER — Ambulatory Visit (HOSPITAL_COMMUNITY): Payer: Self-pay | Admitting: Psychiatry

## 2015-11-04 ENCOUNTER — Telehealth (HOSPITAL_COMMUNITY): Payer: Self-pay | Admitting: *Deleted

## 2015-11-04 NOTE — Telephone Encounter (Signed)
Pt was scheduled to f/u with provider 11-04-15 due was resch due to provider being out of office. Pt need refills for her Xanax QID 08-04-15 360 tabs 2 refills and Zoloft QD 90 tabs 2 refills. Pt pharmacy number is [phone].

## 2015-11-05 ENCOUNTER — Other Ambulatory Visit (HOSPITAL_COMMUNITY): Payer: Self-pay | Admitting: Psychiatry

## 2015-11-05 DIAGNOSIS — R5383 Other fatigue: Secondary | ICD-10-CM | POA: Diagnosis not present

## 2015-11-05 DIAGNOSIS — Z01419 Encounter for gynecological examination (general) (routine) without abnormal findings: Secondary | ICD-10-CM | POA: Diagnosis not present

## 2015-11-05 DIAGNOSIS — Z6822 Body mass index (BMI) 22.0-22.9, adult: Secondary | ICD-10-CM | POA: Diagnosis not present

## 2015-11-05 DIAGNOSIS — Z3200 Encounter for pregnancy test, result unknown: Secondary | ICD-10-CM | POA: Diagnosis not present

## 2015-11-05 MED ORDER — SERTRALINE HCL 100 MG PO TABS: 100.0000 mg | ORAL_TABLET | Freq: Every day | ORAL | 2 refills | Status: DC

## 2015-11-05 NOTE — Telephone Encounter (Signed)
zoloft sent you may call in xanax

## 2015-11-10 ENCOUNTER — Telehealth (HOSPITAL_COMMUNITY): Payer: Self-pay | Admitting: *Deleted

## 2015-11-10 MED ORDER — ALPRAZOLAM 0.5 MG PO TABS: 0.5000 mg | ORAL_TABLET | Freq: Four times a day (QID) | ORAL | 0 refills | Status: DC

## 2015-11-10 NOTE — Telephone Encounter (Signed)
Per Dr. Tenny Craw to call in refill for pt Xanax. Called pt pharmacy and spoke with Natalia Leatherwood.

## 2015-11-10 NOTE — Telephone Encounter (Signed)
Called in pt Xanax to her pharmacy and spoke with Natalia Leatherwood

## 2015-12-09 ENCOUNTER — Telehealth (HOSPITAL_COMMUNITY): Payer: Self-pay | Admitting: *Deleted

## 2015-12-09 ENCOUNTER — Encounter (HOSPITAL_COMMUNITY): Payer: Self-pay

## 2015-12-09 ENCOUNTER — Ambulatory Visit (HOSPITAL_COMMUNITY): Payer: BLUE CROSS/BLUE SHIELD | Admitting: Psychiatry

## 2015-12-09 NOTE — Telephone Encounter (Signed)
Called pt and informed her that refills will be sent to pharmacy today. Pt verbalized understanding.

## 2015-12-09 NOTE — Telephone Encounter (Signed)
patient arrived late for appointment today, at 9:05.  she need refill of Xanax, and sertraline.   She uses [facility].

## 2015-12-09 NOTE — Telephone Encounter (Signed)
Please call in 2 months supply

## 2015-12-09 NOTE — Telephone Encounter (Signed)
Per Dr. Tenny Craw to call in 2 month worth of refills for pt Xanax and Zoloft. Called pt pharmacy and spoke with [facility] the pharmacist and he verbalized understanding.

## 2015-12-10 MED ORDER — ALPRAZOLAM 0.5 MG PO TABS: 0.5000 mg | ORAL_TABLET | Freq: Four times a day (QID) | ORAL | 1 refills | Status: DC

## 2015-12-10 MED ORDER — SERTRALINE HCL 100 MG PO TABS: 100.0000 mg | ORAL_TABLET | Freq: Every day | ORAL | 1 refills | Status: DC

## 2015-12-10 NOTE — Telephone Encounter (Signed)
Called in pt medication to her pharmacy.

## 2016-01-06 ENCOUNTER — Encounter (HOSPITAL_COMMUNITY): Payer: Self-pay | Admitting: Psychiatry

## 2016-01-06 ENCOUNTER — Ambulatory Visit (INDEPENDENT_AMBULATORY_CARE_PROVIDER_SITE_OTHER): Payer: BLUE CROSS/BLUE SHIELD | Admitting: Psychiatry

## 2016-01-06 VITALS — BP 107/71 | HR 82 | Ht 62.5 in | Wt 117.4 lb

## 2016-01-06 DIAGNOSIS — F909 Attention-deficit hyperactivity disorder, unspecified type: Secondary | ICD-10-CM

## 2016-01-06 DIAGNOSIS — F411 Generalized anxiety disorder: Secondary | ICD-10-CM | POA: Diagnosis not present

## 2016-01-06 DIAGNOSIS — F422 Mixed obsessional thoughts and acts: Secondary | ICD-10-CM | POA: Diagnosis not present

## 2016-01-06 DIAGNOSIS — F429 Obsessive-compulsive disorder, unspecified: Secondary | ICD-10-CM

## 2016-01-06 MED ORDER — SERTRALINE HCL 100 MG PO TABS: 100.0000 mg | ORAL_TABLET | Freq: Every day | ORAL | 3 refills | Status: DC

## 2016-01-06 MED ORDER — ALPRAZOLAM 0.5 MG PO TABS: 0.5000 mg | ORAL_TABLET | Freq: Four times a day (QID) | ORAL | 3 refills | Status: DC

## 2016-01-06 NOTE — Progress Notes (Signed)
Patient ID: Randel BooksRebecca S Valek, female   DOB: 04/06/1990, 25 y.o.   MRN: [id number] Patient ID: Randel BooksRebecca S Borgerding, female   DOB: 04/06/1990, [age] y.o.   MRN: [id number] Patient ID: Randel BooksRebecca S Heaslip, female   DOB: 04/06/1990, 25 y.o.   MRN: [id number] Patient ID: Randel BooksRebecca S Dimitrov, female   DOB: 04/06/1990, 25 y.o.   MRN: [id number] Patient ID: Randel BooksRebecca S Bellows, female   DOB: 04/06/1990, 25 y.o.   MRN: [id number] Patient ID: Randel BooksRebecca S Spirito, female   DOB: 04/06/1990, 25 y.o.   MRN: [id number] Patient ID: Randel BooksRebecca S Stene, female   DOB: 04/06/1990, 25 y.o.   MRN: [id number] Patient ID: Randel BooksRebecca S Andreen, female   DOB: 04/06/1990, 25 y.o.   MRN: [id number] Patient ID: Randel BooksRebecca S Mcneeley, female   DOB: 04/06/1990, 25 y.o.   MRN: [id number] Patient ID: Randel BooksRebecca S Marineau, female   DOB: 04/06/1990, 25 y.o.   MRN: [id number] Patient ID: Randel BooksRebecca S Stamper, female   DOB: 04/06/1990, 25 y.o.   MRN: [id number] Octaviano BattyRebecca S Madrigal[birth year]  Subjective I am doing ok  History of present illness. Patient is 25 year old Caucasian married employed female who came for her followup appointment  Her job has been stressful. 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 She often has headaches and feels stressed and overwhelmed. She would like to have something to take for the stress. She had tried Xanax once before for tooth extraction and it really helped her feel calm She also has ADD however she does not have any symptoms of attention focus and concentration problems.  She does not feel that she requires medication for ADD.  Her OCD is much better with the medication.  She sleeping better.  She's not drinking or using any illegal substance.  Patient has not seen therapist for more than 2 months.  Patient admitted, she has busy schedule however she will like to followup with a therapist in the future.  The patient returns after 3 months. She does well most of the time. Her  son is 25 years old and she watches a 511-1/46 year-old girl baby as well. This is keeping her very busy. She worries at times but her worries and obsessions are no her near his persistent as they have been in the past. The Zoloft has helped a good deal. The Xanax helps when she feels very anxious. She is sleeping well and her energy is good  .  Vitals:   01/06/16 1032  BP: 107/71  Pulse: 82   Mental Status Examination Patient is well groomed well dressed female who appears to be in her stated age.She has her hands full with both children today and is handling it quite effectively  She is much less anxious and her affect is bright   Her speech is fluent and coherent.  Her thought processes are logical linear and goal-directed.  There were no tremors or shakes.  She denies any active or passive suicidal thoughts or homicidal thoughts.  She denies any auditory or visual hallucination.  There were no paranoia or delusions present at this time.  Her psychomotor activity is normal She's alert and oriented x3.  Her insight judgment and impulse control is okay.  Current Medication  Current Outpatient Prescriptions:  .  ALPRAZolam (XANAX) 0.5 MG tablet, Take 1 tablet (0.5 mg total) by mouth 4 (four) times daily., Disp: 120 tablet, Rfl: 3 .  Cyanocobalamin (VITAMIN B 12 PO), Take by  mouth daily., Disp: , Rfl:  .  ibuprofen (ADVIL,MOTRIN) 600 MG tablet, Take 1 tablet (600 mg total) by mouth every 6 (six) hours., Disp: 30 tablet, Rfl: 1 .  Multiple Vitamin (MULTIVITAMIN) tablet, Take 1 tablet by mouth daily., Disp: , Rfl:  .  Multiple Vitamins-Minerals (ZINC PO), Take by mouth daily., Disp: , Rfl:  .  polyethylene glycol (MIRALAX / GLYCOLAX) packet, Take 17 g by mouth 2 (two) times daily. , Disp: , Rfl:  .  sertraline (ZOLOFT) 100 MG tablet, Take 1 tablet (100 mg total) by mouth daily., Disp: 90 tablet, Rfl: 3   Assessment Axis I obsessive-compulsive disorder, ADD by history Axis II deferred Axis III  none Axis IV mild Axis V 70-75  Plan  The patient will continue Zoloft 100 mg daily for depression and anxiety. And Xanax 0.5 mg 4 times a day as needed for anxiety. She'll call if any of her symptoms worsen but otherwise will return in 4 months  Diannia Ruder MD

## 2016-02-11 ENCOUNTER — Ambulatory Visit (INDEPENDENT_AMBULATORY_CARE_PROVIDER_SITE_OTHER): Payer: BLUE CROSS/BLUE SHIELD | Admitting: Family Medicine

## 2016-02-11 ENCOUNTER — Encounter: Payer: Self-pay | Admitting: Family Medicine

## 2016-02-11 ENCOUNTER — Ambulatory Visit: Payer: BLUE CROSS/BLUE SHIELD | Admitting: Family Medicine

## 2016-02-11 VITALS — BP 111/75 | HR 95 | Temp 98.5°F | Ht 62.5 in | Wt 117.0 lb

## 2016-02-11 DIAGNOSIS — F411 Generalized anxiety disorder: Secondary | ICD-10-CM | POA: Diagnosis not present

## 2016-02-11 NOTE — Progress Notes (Signed)
Subjective:  Patient ID: Theresa Jackson, female    DOB: 17-Dec-1990  Age: 25 y.o. MRN: [id number]  CC: Referral (to [facility] for depression to off medication)   HPI Theresa Jackson presents for Having trouble tolerating medications. She does like to have a natural approach.  She has heard of a natural pathic practice and would like to have a referral. Much of her symptoms is due to her chronic back pain. She would also like to have referral for this.  Depression screen PHQ 2/9 02/11/2016  Decreased Interest 0  Down, Depressed, Hopeless 0  PHQ - 2 Score 0      History Theresa Jackson has a past medical history of ADHD (attention deficit hyperactivity disorder); Anxiety; Headache(784.0); Obsessive-compulsive disorder; and Seizures (HCC).   She has a past surgical history that includes Tonsillectomy; Tubes in Ears; and Tooth extraction.   Her family history includes ADD / ADHD in her brother, maternal aunt, and maternal aunt; Alcohol abuse in her maternal grandmother; Anxiety disorder in her mother; Bipolar disorder in her maternal grandmother; Dementia in her paternal grandmother; Depression in her maternal grandfather; Hip fracture in her paternal grandmother; Seizures in her cousin.She reports that she has never smoked. She has never used smokeless tobacco. She reports that she drinks about 0.6 oz of alcohol per week . She reports that she does not use drugs.    ROS Review of Systems  Constitutional: Negative for activity change, appetite change and fever.  HENT: Negative for congestion, rhinorrhea and sore throat.   Eyes: Negative for visual disturbance.  Respiratory: Negative for cough and shortness of breath.   Cardiovascular: Negative for chest pain and palpitations.  Gastrointestinal: Negative for abdominal pain, diarrhea and nausea.  Genitourinary: Negative for dysuria.  Musculoskeletal: Positive for arthralgias and back pain. Negative for myalgias.    Psychiatric/Behavioral: Positive for dysphoric mood. The patient is nervous/anxious.     Objective:  BP 111/75   Pulse 95   Temp 98.5 F (36.9 C) (Oral)   Ht 5' 2.5" (1.588 m)   Wt 117 lb (53.1 kg)   BMI 21.06 kg/m   BP Readings from Last 3 Encounters:  02/11/16 111/75  05/22/15 128/78  12/19/13 (!) 142/93    Wt Readings from Last 3 Encounters:  02/11/16 117 lb (53.1 kg)  07/19/15 119 lb 12.8 oz (54.3 kg)  05/22/15 118 lb 6.4 oz (53.7 kg)     Physical Exam  Constitutional: She is oriented to person, place, and time. She appears well-developed and well-nourished. No distress.  HENT:  Head: Normocephalic and atraumatic.  Eyes: Conjunctivae are normal. Pupils are equal, round, and reactive to light.  Neck: Normal range of motion. Neck supple. No thyromegaly present.  Cardiovascular: Normal rate, regular rhythm and normal heart sounds.   No murmur heard. Pulmonary/Chest: Effort normal and breath sounds normal. No respiratory distress. She has no wheezes. She has no rales.  Musculoskeletal: Normal range of motion.  Lymphadenopathy:    She has no cervical adenopathy.  Neurological: She is alert and oriented to person, place, and time.  Skin: Skin is warm and dry.  Psychiatric: She has a normal mood and affect. Her behavior is normal. Judgment and thought content normal.     Lab Results  Component Value Date   WBC 11.5 (H) 12/18/2013   HGB 11.3 (L) 12/18/2013   HCT 32.7 (L) 12/18/2013   PLT 138 (L) 12/18/2013   GLUCOSE 90 12/18/2013   ALT 61 (H) 12/18/2013  AST 61 (H) 12/18/2013   NA 138 12/18/2013   K 4.1 12/18/2013   CL 102 12/18/2013   CREATININE 0.60 12/18/2013   BUN 9 12/18/2013   CO2 24 12/18/2013    [place] Transvaginal Non-ob  Result Date: 07/19/2015 CLINICAL DATA:  Sharp pelvic pain, onset last night. EXAM: TRANSABDOMINAL AND TRANSVAGINAL ULTRASOUND OF PELVIS TECHNIQUE: Both transabdominal and transvaginal ultrasound examinations of the pelvis were  performed. Transabdominal technique was performed for global imaging of the pelvis including uterus, ovaries, adnexal regions, and pelvic cul-de-sac. It was necessary to proceed with endovaginal exam following the transabdominal exam to visualize the endometrium. COMPARISON:  None FINDINGS: Uterus Measurements: 5.6 x 3.6 x 4.2 cm. No fibroids or other mass visualized. IUD appears appropriately positioned. Endometrium Thickness: 5.6 mm.  No focal abnormality visualized. Right ovary Measurements: 2.7 x 2.0 x 2.3 cm. Normal appearance/no adnexal mass. Left ovary Measurements: 1.9 x 1.6 x 1.8 cm. Normal appearance/no adnexal mass. Other findings Moderate volume free pelvic fluid. IMPRESSION: Satisfactorily positioned IUD. Normal uterus and ovaries. Moderate volume free pelvic fluid. Electronically Signed   By: Ellery Plunkaniel R Mitchell M.D.   On: 07/19/2015 22:40   [place] Pelvis Complete  Result Date: 07/19/2015 CLINICAL DATA:  Sharp pelvic pain, onset last night. EXAM: TRANSABDOMINAL AND TRANSVAGINAL ULTRASOUND OF PELVIS TECHNIQUE: Both transabdominal and transvaginal ultrasound examinations of the pelvis were performed. Transabdominal technique was performed for global imaging of the pelvis including uterus, ovaries, adnexal regions, and pelvic cul-de-sac. It was necessary to proceed with endovaginal exam following the transabdominal exam to visualize the endometrium. COMPARISON:  None FINDINGS: Uterus Measurements: 5.6 x 3.6 x 4.2 cm. No fibroids or other mass visualized. IUD appears appropriately positioned. Endometrium Thickness: 5.6 mm.  No focal abnormality visualized. Right ovary Measurements: 2.7 x 2.0 x 2.3 cm. Normal appearance/no adnexal mass. Left ovary Measurements: 1.9 x 1.6 x 1.8 cm. Normal appearance/no adnexal mass. Other findings Moderate volume free pelvic fluid. IMPRESSION: Satisfactorily positioned IUD. Normal uterus and ovaries. Moderate volume free pelvic fluid. Electronically Signed   By: Ellery Plunkaniel R  Mitchell M.D.   On: 07/19/2015 22:40    Assessment & Plan:   Theresa JoinerRebecca was seen today for referral.  Diagnoses and all orders for this visit:  Generalized anxiety disorder -     Ambulatory referral to Pain Clinic  [facility] will be made as needed for the patient. Do not believe a formal referral since they're considered primary care is essential.  I have discontinued Ms. Verdun's ibuprofen. I am also having her maintain her polyethylene glycol, Cyanocobalamin (VITAMIN B 12 PO), Multiple Vitamins-Minerals (ZINC PO), multivitamin, sertraline, and ALPRAZolam.  No orders of the defined types were placed in this encounter.    Follow-up: Return if symptoms worsen or fail to improve.  Mechele ClaudeWarren Janaisha Tolsma, M.D.

## 2016-02-12 DIAGNOSIS — R5383 Other fatigue: Secondary | ICD-10-CM | POA: Diagnosis not present

## 2016-02-16 DIAGNOSIS — Z30431 Encounter for routine checking of intrauterine contraceptive device: Secondary | ICD-10-CM | POA: Diagnosis not present

## 2016-02-16 DIAGNOSIS — F419 Anxiety disorder, unspecified: Secondary | ICD-10-CM | POA: Diagnosis not present

## 2016-02-16 DIAGNOSIS — Z01419 Encounter for gynecological examination (general) (routine) without abnormal findings: Secondary | ICD-10-CM | POA: Diagnosis not present

## 2016-02-16 DIAGNOSIS — Z6821 Body mass index (BMI) 21.0-21.9, adult: Secondary | ICD-10-CM | POA: Diagnosis not present

## 2016-02-18 DIAGNOSIS — Z30432 Encounter for removal of intrauterine contraceptive device: Secondary | ICD-10-CM | POA: Diagnosis not present

## 2016-03-09 ENCOUNTER — Telehealth: Payer: Self-pay | Admitting: Family Medicine

## 2016-03-11 DIAGNOSIS — R3914 Feeling of incomplete bladder emptying: Secondary | ICD-10-CM | POA: Diagnosis not present

## 2016-03-11 DIAGNOSIS — N390 Urinary tract infection, site not specified: Secondary | ICD-10-CM | POA: Diagnosis not present

## 2016-05-05 ENCOUNTER — Telehealth (HOSPITAL_COMMUNITY): Payer: Self-pay | Admitting: *Deleted

## 2016-05-05 NOTE — Telephone Encounter (Signed)
ok 

## 2016-05-05 NOTE — Telephone Encounter (Signed)
phone call from patient.  [redacted] weeks pregnant.  She said her OBGYN has been working with her.    she said she is on a small doseage of the sertraline (ZOLOFT) 100 MG tablet.

## 2016-05-07 ENCOUNTER — Ambulatory Visit (HOSPITAL_COMMUNITY): Payer: Self-pay | Admitting: Psychiatry

## 2016-05-11 DIAGNOSIS — N911 Secondary amenorrhea: Secondary | ICD-10-CM | POA: Diagnosis not present

## 2016-05-19 DIAGNOSIS — Z3481 Encounter for supervision of other normal pregnancy, first trimester: Secondary | ICD-10-CM | POA: Diagnosis not present

## 2016-05-19 DIAGNOSIS — Z3480 Encounter for supervision of other normal pregnancy, unspecified trimester: Secondary | ICD-10-CM | POA: Diagnosis not present

## 2016-05-19 DIAGNOSIS — Z13228 Encounter for screening for other metabolic disorders: Secondary | ICD-10-CM | POA: Diagnosis not present

## 2016-05-19 LAB — OB RESULTS CONSOLE ANTIBODY SCREEN: Antibody Screen: NEGATIVE

## 2016-05-19 LAB — OB RESULTS CONSOLE GC/CHLAMYDIA
Chlamydia: NEGATIVE
GC PROBE AMP, GENITAL: NEGATIVE

## 2016-05-19 LAB — OB RESULTS CONSOLE HEPATITIS B SURFACE ANTIGEN: Hepatitis B Surface Ag: NEGATIVE

## 2016-05-19 LAB — OB RESULTS CONSOLE RPR: RPR: NONREACTIVE

## 2016-05-19 LAB — OB RESULTS CONSOLE HIV ANTIBODY (ROUTINE TESTING): HIV: NONREACTIVE

## 2016-05-19 LAB — OB RESULTS CONSOLE RUBELLA ANTIBODY, IGM: Rubella: IMMUNE

## 2016-05-19 LAB — OB RESULTS CONSOLE ABO/RH: RH TYPE: POSITIVE

## 2016-06-15 DIAGNOSIS — Z36 Encounter for antenatal screening for chromosomal anomalies: Secondary | ICD-10-CM | POA: Diagnosis not present

## 2016-06-15 DIAGNOSIS — Z3491 Encounter for supervision of normal pregnancy, unspecified, first trimester: Secondary | ICD-10-CM | POA: Diagnosis not present

## 2016-06-15 DIAGNOSIS — Z113 Encounter for screening for infections with a predominantly sexual mode of transmission: Secondary | ICD-10-CM | POA: Diagnosis not present

## 2016-07-13 ENCOUNTER — Encounter: Payer: Self-pay | Admitting: Family

## 2016-07-13 ENCOUNTER — Ambulatory Visit (INDEPENDENT_AMBULATORY_CARE_PROVIDER_SITE_OTHER): Payer: BLUE CROSS/BLUE SHIELD | Admitting: Family

## 2016-07-13 VITALS — BP 119/71 | HR 97 | Temp 97.1°F | Ht 62.5 in | Wt 134.0 lb

## 2016-07-13 DIAGNOSIS — J301 Allergic rhinitis due to pollen: Secondary | ICD-10-CM | POA: Diagnosis not present

## 2016-07-13 MED ORDER — FLUTICASONE PROPIONATE 50 MCG/ACT NA SUSP: 2.0000 | Freq: Every day | NASAL | 6 refills | Status: DC

## 2016-07-13 MED ORDER — LORATADINE 10 MG PO TABS: 10.0000 mg | ORAL_TABLET | Freq: Every day | ORAL | 11 refills | Status: DC

## 2016-07-13 NOTE — Patient Instructions (Signed)
Allergic Rhinitis Allergic rhinitis is when the mucous membranes in the nose respond to allergens. Allergens are particles in the air that cause your body to have an allergic reaction. This causes you to release allergic antibodies. Through a chain of events, these eventually cause you to release histamine into the blood stream. Although meant to protect the body, it is this release of histamine that causes your discomfort, such as frequent sneezing, congestion, and an itchy, runny nose. What are the causes? Seasonal allergic rhinitis (hay fever) is caused by pollen allergens that may come from grasses, trees, and weeds. Year-round allergic rhinitis (perennial allergic rhinitis) is caused by allergens such as house dust mites, pet dander, and mold spores. What are the signs or symptoms?  Nasal stuffiness (congestion).  Itchy, runny nose with sneezing and tearing of the eyes. How is this diagnosed? Your health care provider can help you determine the allergen or allergens that trigger your symptoms. If you and your health care provider are unable to determine the allergen, skin or blood testing may be used. Your health care provider will diagnose your condition after taking your health history and performing a physical exam. Your health care provider may assess you for other related conditions, such as asthma, pink eye, or an ear infection. How is this treated? Allergic rhinitis does not have a cure, but it can be controlled by:  Medicines that block allergy symptoms. These may include allergy shots, nasal sprays, and oral antihistamines.  Avoiding the allergen. Hay fever may often be treated with antihistamines in pill or nasal spray forms. Antihistamines block the effects of histamine. There are over-the-counter medicines that may help with nasal congestion and swelling around the eyes. Check with your health care provider before taking or giving this medicine. If avoiding the allergen or the  medicine prescribed do not work, there are many new medicines your health care provider can prescribe. Stronger medicine may be used if initial measures are ineffective. Desensitizing injections can be used if medicine and avoidance does not work. Desensitization is when a patient is given ongoing shots until the body becomes less sensitive to the allergen. Make sure you follow up with your health care provider if problems continue. Follow these instructions at home: It is not possible to completely avoid allergens, but you can reduce your symptoms by taking steps to limit your exposure to them. It helps to know exactly what you are allergic to so that you can avoid your specific triggers. Contact a health care provider if:  You have a fever.  You develop a cough that does not stop easily (persistent).  You have shortness of breath.  You start wheezing.  Symptoms interfere with normal daily activities. This information is not intended to replace advice given to you by your health care provider. Make sure you discuss any questions you have with your health care provider. Document Released: 12/15/2000 Document Revised: 11/21/2015 Document Reviewed: 11/27/2012 Elsevier Interactive Patient Education  2017 [employer]vier Inc.  

## 2016-07-13 NOTE — Progress Notes (Signed)
   Subjective:    Patient ID: Theresa Jackson, female    DOB: [birth year], 26 y.o.   MRN: [id number]  PT presents to the office today with otalgia who is [redacted] weeks pregnant.  Otalgia   There is pain in both ears. The current episode started in the past 7 days. The problem occurs every few minutes. The problem has been waxing and waning. There has been no fever. The pain is at a severity of 5/10. The pain is moderate. Associated symptoms include coughing, headaches, rhinorrhea and a sore throat. Pertinent negatives include no ear discharge or vomiting. Treatments tried: claritin and benadryl. The treatment provided mild relief.      Review of Systems  HENT: Positive for ear pain, rhinorrhea and sore throat. Negative for ear discharge.   Respiratory: Positive for cough.   Gastrointestinal: Negative for vomiting.  Neurological: Positive for headaches.  All other systems reviewed and are negative.      Objective:   Physical Exam  Constitutional: She is oriented to person, place, and time. She appears well-developed and well-nourished. No distress.  HENT:  Head: Normocephalic and atraumatic.  Right Ear: External ear normal.  Left Ear: External ear normal.  Nose: Mucosal edema and rhinorrhea present.  Mouth/Throat: Posterior oropharyngeal erythema present.  Eyes: Pupils are equal, round, and reactive to light.  Neck: Normal range of motion. Neck supple. No thyromegaly present.  Cardiovascular: Normal rate, regular rhythm, normal heart sounds and intact distal pulses.   No murmur heard. Pulmonary/Chest: Effort normal and breath sounds normal. No respiratory distress. She has no wheezes.  Abdominal: Soft. Bowel sounds are normal. She exhibits no distension. There is no tenderness.  Musculoskeletal: Normal range of motion. She exhibits no edema or tenderness.  Neurological: She is alert and oriented to person, place, and time. She has normal reflexes. No cranial nerve deficit.  Skin: Skin  is warm and dry.  Psychiatric: She has a normal mood and affect. Her behavior is normal. Judgment and thought content normal.  Vitals reviewed.     BP 119/71   Pulse 97   Temp 97.1 F (36.2 C) (Oral)   Ht 5' 2.5" (1.588 m)   Wt 134 lb (60.8 kg)   BMI 24.12 kg/m      Assessment & Plan:  1. Allergic rhinitis due to pollen, unspecified chronicity, unspecified seasonality -Avoid allergens -Rest -Force fluids RTO prn - fluticasone (FLONASE) 50 MCG/ACT nasal spray; Place 2 sprays into both nostrils daily.  Dispense: 16 g; Refill: 6 - loratadine (CLARITIN) 10 MG tablet; Take 1 tablet (10 mg total) by mouth daily.  Dispense: 30 tablet; Refill: 11   Jannifer Rodney, FNP

## 2016-07-16 DIAGNOSIS — Z3480 Encounter for supervision of other normal pregnancy, unspecified trimester: Secondary | ICD-10-CM | POA: Diagnosis not present

## 2016-07-16 DIAGNOSIS — Z36 Encounter for antenatal screening for chromosomal anomalies: Secondary | ICD-10-CM | POA: Diagnosis not present

## 2016-07-26 DIAGNOSIS — Z363 Encounter for antenatal screening for malformations: Secondary | ICD-10-CM | POA: Diagnosis not present

## 2016-09-24 DIAGNOSIS — Z3480 Encounter for supervision of other normal pregnancy, unspecified trimester: Secondary | ICD-10-CM | POA: Diagnosis not present

## 2016-09-24 DIAGNOSIS — Z3A26 26 weeks gestation of pregnancy: Secondary | ICD-10-CM | POA: Diagnosis not present

## 2016-09-24 DIAGNOSIS — Z23 Encounter for immunization: Secondary | ICD-10-CM | POA: Diagnosis not present

## 2016-11-23 LAB — OB RESULTS CONSOLE GBS: GBS: NEGATIVE

## 2016-12-20 ENCOUNTER — Telehealth (HOSPITAL_COMMUNITY): Payer: Self-pay | Admitting: *Deleted

## 2016-12-20 ENCOUNTER — Encounter (HOSPITAL_COMMUNITY): Payer: Self-pay | Admitting: *Deleted

## 2016-12-20 NOTE — Telephone Encounter (Signed)
Preadmission screen  

## 2016-12-24 ENCOUNTER — Encounter (HOSPITAL_COMMUNITY): Payer: Self-pay

## 2016-12-24 ENCOUNTER — Inpatient Hospital Stay (HOSPITAL_COMMUNITY)
Admission: RE | Admit: 2016-12-24 | Discharge: 2016-12-25 | DRG: 775 | Disposition: A | Discharge status: Home / Self Care | Payer: Medicaid Other | Source: Ambulatory Visit | Attending: Obstetrics and Gynecology | Admitting: Obstetrics and Gynecology

## 2016-12-24 ENCOUNTER — Inpatient Hospital Stay (HOSPITAL_COMMUNITY): Payer: Medicaid Other | Admitting: Anesthesiology

## 2016-12-24 DIAGNOSIS — Z3A39 39 weeks gestation of pregnancy: Secondary | ICD-10-CM

## 2016-12-24 DIAGNOSIS — Z3490 Encounter for supervision of normal pregnancy, unspecified, unspecified trimester: Secondary | ICD-10-CM

## 2016-12-24 DIAGNOSIS — O26893 Other specified pregnancy related conditions, third trimester: Secondary | ICD-10-CM | POA: Diagnosis present

## 2016-12-24 LAB — CBC
HCT: 40.2 % (ref 36.0–46.0)
Hemoglobin: 14.2 g/dL (ref 12.0–15.0)
MCH: 35.1 pg — AB (ref 26.0–34.0)
MCHC: 35.3 g/dL (ref 30.0–36.0)
MCV: 99.5 fL (ref 78.0–100.0)
PLATELETS: 146 10*3/uL — AB (ref 150–400)
RBC: 4.04 MIL/uL (ref 3.87–5.11)
RDW: 13.5 % (ref 11.5–15.5)
WBC: 7.8 10*3/uL (ref 4.0–10.5)

## 2016-12-24 LAB — TYPE AND SCREEN
ABO/RH(D): O POS
Antibody Screen: NEGATIVE

## 2016-12-24 LAB — RPR: RPR Ser Ql: NONREACTIVE

## 2016-12-24 MED ORDER — METHYLERGONOVINE MALEATE 0.2 MG/ML IJ SOLN
INTRAMUSCULAR | Status: AC
  Filled 2016-12-24: qty 1

## 2016-12-24 MED ORDER — MEDROXYPROGESTERONE ACETATE 150 MG/ML IM SUSP: 150.0000 mg | INTRAMUSCULAR | Status: DC | PRN

## 2016-12-24 MED ORDER — OXYCODONE-ACETAMINOPHEN 5-325 MG PO TABS: 1.0000 | ORAL_TABLET | ORAL | Status: DC | PRN

## 2016-12-24 MED ORDER — DIPHENHYDRAMINE HCL 25 MG PO CAPS: 25.0000 mg | ORAL_CAPSULE | Freq: Four times a day (QID) | ORAL | Status: DC | PRN

## 2016-12-24 MED ORDER — LIDOCAINE HCL (PF) 1 % IJ SOLN
30.0000 mL | INTRAMUSCULAR | Status: DC | PRN
  Filled 2016-12-24: qty 30

## 2016-12-24 MED ORDER — OXYCODONE-ACETAMINOPHEN 5-325 MG PO TABS: 2.0000 | ORAL_TABLET | ORAL | Status: DC | PRN

## 2016-12-24 MED ORDER — OXYTOCIN 40 UNITS IN LACTATED RINGERS INFUSION - SIMPLE MED
1.0000 m[IU]/min | INTRAVENOUS | Status: DC
  Administered 2016-12-24: 2 m[IU]/min via INTRAVENOUS
  Filled 2016-12-24: qty 1000

## 2016-12-24 MED ORDER — EPHEDRINE 5 MG/ML INJ
10.0000 mg | INTRAVENOUS | Status: DC | PRN
  Filled 2016-12-24: qty 2

## 2016-12-24 MED ORDER — ONDANSETRON HCL 4 MG PO TABS: 4.0000 mg | ORAL_TABLET | ORAL | Status: DC | PRN

## 2016-12-24 MED ORDER — SOD CITRATE-CITRIC ACID 500-334 MG/5ML PO SOLN
30.0000 mL | ORAL | Status: DC | PRN
  Administered 2016-12-24: 30 mL via ORAL
  Filled 2016-12-24: qty 15

## 2016-12-24 MED ORDER — PHENYLEPHRINE 40 MCG/ML (10ML) SYRINGE FOR IV PUSH (FOR BLOOD PRESSURE SUPPORT)
80.0000 ug | PREFILLED_SYRINGE | INTRAVENOUS | Status: DC | PRN
  Filled 2016-12-24: qty 5

## 2016-12-24 MED ORDER — MISOPROSTOL 25 MCG QUARTER TABLET
25.0000 ug | ORAL_TABLET | ORAL | Status: DC | PRN
  Administered 2016-12-24: 25 ug via VAGINAL
  Filled 2016-12-24 (×2): qty 1

## 2016-12-24 MED ORDER — LACTATED RINGERS IV SOLN
INTRAVENOUS | Status: DC
Start: 2016-12-24 — End: 2016-12-24
  Administered 2016-12-24 (×2): via INTRAVENOUS

## 2016-12-24 MED ORDER — SIMETHICONE 80 MG PO CHEW
80.0000 mg | CHEWABLE_TABLET | ORAL | Status: DC | PRN
  Administered 2016-12-25: 80 mg via ORAL
  Filled 2016-12-24: qty 1

## 2016-12-24 MED ORDER — COCONUT OIL OIL: 1.0000 "application " | TOPICAL_OIL | Status: DC | PRN

## 2016-12-24 MED ORDER — IBUPROFEN 600 MG PO TABS
600.0000 mg | ORAL_TABLET | Freq: Four times a day (QID) | ORAL | Status: DC
  Administered 2016-12-24 – 2016-12-25 (×4): 600 mg via ORAL
  Filled 2016-12-24 (×4): qty 1

## 2016-12-24 MED ORDER — PRENATAL MULTIVITAMIN CH
1.0000 | ORAL_TABLET | Freq: Every day | ORAL | Status: DC
  Administered 2016-12-25: 1 via ORAL
  Filled 2016-12-24: qty 1

## 2016-12-24 MED ORDER — ONDANSETRON HCL 4 MG/2ML IJ SOLN: 4.0000 mg | INTRAMUSCULAR | Status: DC | PRN

## 2016-12-24 MED ORDER — TERBUTALINE SULFATE 1 MG/ML IJ SOLN
0.2500 mg | Freq: Once | INTRAMUSCULAR | Status: DC | PRN
  Filled 2016-12-24: qty 1

## 2016-12-24 MED ORDER — LIDOCAINE HCL (PF) 1 % IJ SOLN
INTRAMUSCULAR | Status: DC | PRN
  Administered 2016-12-24 (×2): 4 mL via EPIDURAL

## 2016-12-24 MED ORDER — OXYCODONE-ACETAMINOPHEN 5-325 MG PO TABS
1.0000 | ORAL_TABLET | ORAL | Status: DC | PRN
Start: 2016-12-24 — End: 2016-12-25

## 2016-12-24 MED ORDER — METHYLERGONOVINE MALEATE 0.2 MG/ML IJ SOLN
0.2000 mg | Freq: Once | INTRAMUSCULAR | Status: AC
  Administered 2016-12-24: 0.2 mg via INTRAMUSCULAR

## 2016-12-24 MED ORDER — BENZOCAINE-MENTHOL 20-0.5 % EX AERO
1.0000 "application " | INHALATION_SPRAY | CUTANEOUS | Status: DC | PRN
  Filled 2016-12-24: qty 56

## 2016-12-24 MED ORDER — PHENYLEPHRINE 40 MCG/ML (10ML) SYRINGE FOR IV PUSH (FOR BLOOD PRESSURE SUPPORT)
80.0000 ug | PREFILLED_SYRINGE | INTRAVENOUS | Status: DC | PRN
  Filled 2016-12-24: qty 5
  Filled 2016-12-24: qty 10

## 2016-12-24 MED ORDER — LACTATED RINGERS IV SOLN: 500.0000 mL | INTRAVENOUS | Status: DC | PRN

## 2016-12-24 MED ORDER — ACETAMINOPHEN 325 MG PO TABS: 650.0000 mg | ORAL_TABLET | ORAL | Status: DC | PRN

## 2016-12-24 MED ORDER — FENTANYL 2.5 MCG/ML BUPIVACAINE 1/10 % EPIDURAL INFUSION (WH - ANES)
14.0000 mL/h | INTRAMUSCULAR | Status: DC | PRN
  Administered 2016-12-24: 12.5 mL/h via EPIDURAL
  Filled 2016-12-24: qty 100

## 2016-12-24 MED ORDER — DIBUCAINE 1 % RE OINT
1.0000 | TOPICAL_OINTMENT | RECTAL | Status: DC | PRN
Start: 2016-12-24 — End: 2016-12-25

## 2016-12-24 MED ORDER — MEASLES, MUMPS & RUBELLA VAC ~~LOC~~ INJ
0.5000 mL | INJECTION | Freq: Once | SUBCUTANEOUS | Status: DC
  Filled 2016-12-24: qty 0.5

## 2016-12-24 MED ORDER — SENNOSIDES-DOCUSATE SODIUM 8.6-50 MG PO TABS
2.0000 | ORAL_TABLET | ORAL | Status: DC
  Administered 2016-12-25: 2 via ORAL
  Filled 2016-12-24: qty 2

## 2016-12-24 MED ORDER — ONDANSETRON HCL 4 MG/2ML IJ SOLN: 4.0000 mg | Freq: Four times a day (QID) | INTRAMUSCULAR | Status: DC | PRN

## 2016-12-24 MED ORDER — DIPHENHYDRAMINE HCL 50 MG/ML IJ SOLN: 12.5000 mg | INTRAMUSCULAR | Status: DC | PRN

## 2016-12-24 MED ORDER — TETANUS-DIPHTH-ACELL PERTUSSIS 5-2.5-18.5 LF-MCG/0.5 IM SUSP: 0.5000 mL | Freq: Once | INTRAMUSCULAR | Status: DC

## 2016-12-24 MED ORDER — OXYTOCIN 40 UNITS IN LACTATED RINGERS INFUSION - SIMPLE MED: 2.5000 [IU]/h | INTRAVENOUS | Status: DC

## 2016-12-24 MED ORDER — WITCH HAZEL-GLYCERIN EX PADS: 1.0000 "application " | MEDICATED_PAD | CUTANEOUS | Status: DC | PRN

## 2016-12-24 MED ORDER — LACTATED RINGERS IV SOLN: 500.0000 mL | Freq: Once | INTRAVENOUS | Status: DC

## 2016-12-24 MED ORDER — OXYTOCIN BOLUS FROM INFUSION: 500.0000 mL | Freq: Once | INTRAVENOUS | Status: DC

## 2016-12-24 NOTE — Progress Notes (Signed)
UR chart review completed.  

## 2016-12-24 NOTE — Anesthesia Postprocedure Evaluation (Signed)
Anesthesia Post Note  Patient: Theresa Jackson  Procedure(s) Performed: * No procedures listed *     Patient location during evaluation: Mother Baby Anesthesia Type: Epidural Level of consciousness: awake and alert Pain management: pain level controlled Vital Signs Assessment: post-procedure vital signs reviewed and stable Respiratory status: spontaneous breathing, nonlabored ventilation and respiratory function stable Cardiovascular status: stable Postop Assessment: no headache, no backache and epidural receding Anesthetic complications: no    Last Vitals:  Vitals:   12/24/16 1303 12/24/16 1422  BP: (!) 141/78 118/86  Pulse: 68 80  Resp: 18 18  Temp: 36.8 C 36.8 C  SpO2:      Last Pain:  Vitals:   12/24/16 1307  TempSrc:   PainSc: 3    Pain Goal: Patients Stated Pain Goal: 2 (12/24/16 1307)               Junious Silk

## 2016-12-24 NOTE — Lactation Note (Signed)
This note was copied from a baby's chart. Lactation Consultation Note  Patient Name: Theresa Jackson PVVZS'M Date: 12/24/2016 Reason for consult: Follow-up assessment Baby at 6 hr of life. Upon entry baby was sleeping sts with mom. Mom requested lactation to answer questions. Gave parents information about pumping and plugged ducts.   Maternal Data    Feeding    LATCH Score                   Interventions    Lactation Tools Discussed/Used     Consult Status Consult Status: Follow-up Date: 12/25/16 Follow-up type: In-patient    [name] 12/24/2016, 5:37 PM

## 2016-12-24 NOTE — H&P (Signed)
Theresa Jackson is a 26 y.o. female presenting for IOL.  Pregnancy uncomplicated.  OB History    Gravida Para Term Preterm AB Living   2 1 1     1    SAB TAB Ectopic Multiple Live Births           1     Past Medical History:  Diagnosis Date  . ADHD (attention deficit hyperactivity disorder)   . Anxiety   . Headache(784.0)   . Hx of pre-eclampsia in prior pregnancy, currently pregnant   . Obsessive-compulsive disorder   . Seizures (HCC)    Past Surgical History:  Procedure Laterality Date  . TONSILLECTOMY    . TOOTH EXTRACTION    . Tubes in Ears     Family History: family history includes ADD / ADHD in her brother, maternal aunt, and maternal aunt; Alcohol abuse in her maternal grandmother; Anxiety disorder in her mother; Bipolar disorder in her maternal grandmother; Breast cancer in her maternal grandmother and paternal grandmother; Dementia in her paternal grandmother; Depression in her maternal grandfather; Heart disease in her maternal grandmother; Hip fracture in her paternal grandmother; Hypertension in her mother; Seizures in her cousin. Social History:  reports that she has never smoked. She has never used smokeless tobacco. She reports that she drinks about 0.6 oz of alcohol per week . She reports that she does not use drugs.     Maternal Diabetes: No Genetic Screening: Declined Maternal Ultrasounds/Referrals: Normal Fetal Ultrasounds or other Referrals:  None Maternal Substance Abuse:  No Significant Maternal Medications:  None Significant Maternal Lab Results:  None Other Comments:  None  ROS History Dilation: 3.5 Effacement (%): 50 Station: -2 Exam by:: J.Cox, RN Blood pressure 105/67, pulse 79, temperature 98.1 F (36.7 C), temperature source Oral, resp. rate 16, height 5\' 2"  (1.575 m), weight 146 lb (66.2 kg), last menstrual period 03/22/2016. Exam Physical Exam  Gen - NAD ABd - gravid, NT Ext - NT Prenatal labs: ABO, Rh: --/--/O POS (09/21  0215) Antibody: NEG (09/21 0215) Rubella: Immune (02/14 0000) RPR: Nonreactive (02/14 0000)  HBsAg: Negative (02/14 0000)  HIV: Non-reactive (02/14 0000)  GBS: Negative (08/21 0000)   Assessment/Plan: Admit Cytotec/pitocin Epidural prn   Theresa Jackson 12/24/2016, 7:57 AM

## 2016-12-24 NOTE — Anesthesia Preprocedure Evaluation (Addendum)
Anesthesia Evaluation  Patient identified by MRN, date of birth, ID band Patient awake    Reviewed: Allergy & Precautions, Patient's Chart, lab work & pertinent test results  Airway Mallampati: II  TM Distance: >3 FB Neck ROM: Full    Dental no notable dental hx. (+) Teeth Intact   Pulmonary neg pulmonary ROS,    Pulmonary exam normal breath sounds clear to auscultation       Cardiovascular negative cardio ROS Normal cardiovascular exam Rhythm:Regular Rate:Normal     Neuro/Psych  Headaches, Seizures -, Well Controlled,  PSYCHIATRIC DISORDERS Anxiety ADHD OCD   GI/Hepatic Neg liver ROS, GERD  ,  Endo/Other  negative endocrine ROS  Renal/GU negative Renal ROS  negative genitourinary   Musculoskeletal negative musculoskeletal ROS (+)   Abdominal   Peds  Hematology negative hematology ROS (+)   Anesthesia Other Findings Levoscoliosis of Lumbar spine  Reproductive/Obstetrics (+) Pregnancy                            Anesthesia Physical Anesthesia Plan  ASA: II  Anesthesia Plan: Epidural   Post-op Pain Management:    Induction:   PONV Risk Score and Plan:   Airway Management Planned: Natural Airway  Additional Equipment:   Intra-op Plan:   Post-operative Plan:   Informed Consent: I have reviewed the patients History and Physical, chart, labs and discussed the procedure including the risks, benefits and alternatives for the proposed anesthesia with the patient or authorized representative who has indicated his/her understanding and acceptance.     Plan Discussed with: Anesthesiologist  Anesthesia Plan Comments:         Anesthesia Quick Evaluation

## 2016-12-24 NOTE — Progress Notes (Signed)
SVD of vigorous female infant w/ apgars of 8,9.  Placenta delivered spontaneous w/ 3VC.   1st degree lac repaired w/ 3-0 vicryl rapide.  Fundus firm.  EBL 350cc .

## 2016-12-24 NOTE — Anesthesia Procedure Notes (Signed)
Epidural Patient location during procedure: OB Start time: 12/24/2016 8:57 AM  Preanesthetic Checklist Completed: patient identified, site marked, surgical consent, pre-op evaluation, timeout performed, IV checked, risks and benefits discussed and monitors and equipment checked  Epidural Patient position: sitting Prep: site prepped and draped and DuraPrep Patient monitoring: continuous pulse ox and blood pressure Approach: midline Location: L3-L4 Injection technique: LOR air  Needle:  Needle type: Tuohy  Needle gauge: 17 G Needle length: 9 cm and 9 Needle insertion depth: 4 cm Catheter type: closed end flexible Catheter size: 19 Gauge Catheter at skin depth: 8 cm Test dose: negative and Other  Assessment Events: blood not aspirated, injection not painful, no injection resistance, negative IV test and no paresthesia  Additional Notes Patient identified. Risks and benefits discussed including failed block, incomplete  Pain control, post dural puncture headache, nerve damage, paralysis, blood pressure Changes, nausea, vomiting, reactions to medications-both toxic and allergic and post Partum back pain. All questions were answered. Patient expressed understanding and wished to proceed. Sterile technique was used throughout procedure. Epidural site was Dressed with sterile barrier dressing. No paresthesias, signs of intravascular injection Or signs of intrathecal spread were encountered.  Patient was more comfortable after the epidural was dosed. Please see RN's note for documentation of vital signs and FHR which are stable.

## 2016-12-24 NOTE — Lactation Note (Signed)
This note was copied from a baby's chart. Lactation Consultation Note  Patient Name: Theresa Jackson [id number] Date: 12/24/2016 Reason for consult: Initial assessment Baby was at 30 minutes of life. Upon entry baby was cueing in visitor's arms. Mom was agreeable to lace help. Baby was placed in football position on both breasts and with several attempts was able to maintain long bursts of sucking. Mom has large, soft breasts with short, shaft nipples. Mom had "bad" experience breast feeding with her first child. She is worried about supply and "tounge tie". Discussed baby behavior, feeding frequency, baby belly size, voids, wt loss, breast changes, and nipple care. Demonstrated manual expression, colostrum noted bilaterally, reviewed spoon feeding. Given lactation handouts. Aware of OP services and support group.     Maternal Data Has patient been taught Hand Expression?: Yes Does the patient have breastfeeding experience prior to this delivery?: Yes  Feeding Feeding Type: Breast Fed Length of feed: 15 min  LATCH Score Latch: Repeated attempts needed to sustain latch, nipple held in mouth throughout feeding, stimulation needed to elicit sucking reflex.  Audible Swallowing: A few with stimulation  Type of Nipple: Everted at rest and after stimulation  Comfort (Breast/Nipple): Soft / non-tender  Hold (Positioning): Assistance needed to correctly position infant at breast and maintain latch.  LATCH Score: 7  Interventions Interventions: Breast feeding basics reviewed;Assisted with latch;Skin to skin;Breast massage;Hand express;Breast compression;Adjust position;Support pillows;Position options  Lactation Tools Discussed/Used     Consult Status Consult Status: Follow-up Date: 12/25/16 Follow-up type: In-patient    [name] 12/24/2016, 12:30 PM

## 2016-12-24 NOTE — Anesthesia Pain Management Evaluation Note (Signed)
  [facility] Pain Management Visit Note  Patient: Theresa Jackson, 26 y.o., female  "Hello I am a member of the anesthesia team at [facility]. We have an anesthesia team available at all times to provide care throughout the hospital, including epidural management and anesthesia for C-section. I don't know your plan for the delivery whether it a natural birth, water birth, IV sedation, nitrous supplementation, doula or epidural, but we want to meet your pain goals."   1.Was your pain managed to your expectations on prior hospitalizations?   Yes   2.What is your expectation for pain management during this hospitalization?     Epidural  3.How can we help you reach that goal? epidural  Record the patient's initial score and the patient's pain goal.   Pain: 0  Pain Goal: 4 [facility] wants you to be able to say your pain was always managed very well.  Madison Hickman 12/24/2016

## 2016-12-24 NOTE — Progress Notes (Signed)
Pt feeling cramps S/p AROM - clear A/P:  Continue pitocin Epidural prn

## 2016-12-25 LAB — CBC
HCT: 40.6 % (ref 36.0–46.0)
Hemoglobin: 14.1 g/dL (ref 12.0–15.0)
MCH: 34.6 pg — ABNORMAL HIGH (ref 26.0–34.0)
MCHC: 34.7 g/dL (ref 30.0–36.0)
MCV: 99.8 fL (ref 78.0–100.0)
PLATELETS: 129 10*3/uL — AB (ref 150–400)
RBC: 4.07 MIL/uL (ref 3.87–5.11)
RDW: 13.6 % (ref 11.5–15.5)
WBC: 7.8 10*3/uL (ref 4.0–10.5)

## 2016-12-25 MED ORDER — IBUPROFEN 600 MG PO TABS: 600.0000 mg | ORAL_TABLET | Freq: Four times a day (QID) | ORAL | 0 refills | Status: DC

## 2016-12-25 NOTE — Discharge Summary (Signed)
Obstetric Discharge Summary Reason for Admission: induction of labor Prenatal Procedures: ultrasound Intrapartum Procedures: spontaneous vaginal delivery Postpartum Procedures: none Complications-Operative and Postpartum: 1st degree lac Hemoglobin  Date Value Ref Range Status  12/25/2016 14.1 12.0 - 15.0 g/dL Final   HCT  Date Value Ref Range Status  12/25/2016 40.6 36.0 - 46.0 % Final    Physical Exam:  General: alert and cooperative Lochia: appropriate Uterine Fundus: firm Incision: healing well DVT Evaluation: No evidence of DVT seen on physical exam.  Discharge Diagnoses: Term Pregnancy-delivered  Discharge Information: Date: 12/25/2016 Activity: pelvic rest Diet: routine Medications: PNV, Ibuprofen and zoloft Condition: stable Instructions: refer to practice specific booklet Discharge to: home Follow-up Information    [place], [facility] Schedule an appointment as soon as possible for a visit in 6 week(s).   Contact information: [address] [place] [address] [phone]           Newborn Data: Live born female  Birth Weight: 6 lb 7.2 oz (2925 g) APGAR: 8, 9  Home with mother.  Theresa Jackson 12/25/2016, 9:30 AM

## 2016-12-25 NOTE — Progress Notes (Signed)
Theresa Jackson was referred for history of depression/anxiety.  Referral is screened out by Clinical Social Worker because none of the following criteria appear to apply and there are no reports impacting the pregnancy or her transition to the postpartum period. CSW does not deem it clinically necessary to further investigate at this time.  -History of anxiety/depression during this pregnancy, or of post-partum depression.  - Diagnosis of anxiety and/or depression within last 3 years.-  - History of depression due to pregnancy loss/loss of child or -Theresa Jackson's symptoms are currently being treated with medication and/or therapy. Zoloft 25mg   CSW met with Theresa Jackson at bedside to assess hx of anxiety, ADHD and OCD. At this time, this writer was informed by Theresa Jackson that these are old dx and she is not currently dealing with new onset of symptoms. Theresa Jackson notes she has a plan in place with her provider should things change within includes increasing her dosage of Zoloft if need be. Please contact the Clinical Social Worker if needs arise or upon Theresa Jackson request.   Theresa Jackson, MSW, LCSW-A Clinical Social Worker  [facility]  Office: [phone]  

## 2016-12-25 NOTE — Lactation Note (Signed)
This note was copied from a baby's chart. Lactation Consultation Note Mom getting upset d/t baby hadn't BF in 11 hrs. RN discussed newborn feeding habits. Mom not excepting that explanation. Mom starting to ask for formula because she wanted to see food going into the baby. RN told LC that baby had a huge emesis. RN suctioned baby.  Baby rested well afterwards. LC visited mom, mom holding baby resting in bed. Discussed w/mom baby spitting, and not BF. Mom stated she understood that now. Discussed cluster feeding, I&O, STS, hand expression, and spoon feeding.  Hand expressed 4 ml colostrum. Mom excited. Encouraged to give colostrum w/spoon to stimulate baby to feed in 2-3 hours if hasn't cued to BF. Mom had a lot of questions. Before LC leaving, mom stated she wanted to go home after 24 hrs. LC discussed w/mom criteria for d/c home. Good I&O, good BF, mom BF comfortably.  Spoke w/ mom about relaxation, enjoying her baby, assessing breast before BF and aft wards for transfer.  Spoke w/RN of consult.  Patient Name: Theresa Jackson [id number] Date: 12/25/2016 Reason for consult: Follow-up assessment;Mother's request   Maternal Data    Feeding Feeding Type: Breast Fed Length of feed: 0 min  LATCH Score Latch: Too sleepy or reluctant, no latch achieved, no sucking elicited.  Audible Swallowing: None  Type of Nipple: Everted at rest and after stimulation  Comfort (Breast/Nipple): Soft / non-tender  Hold (Positioning): Assistance needed to correctly position infant at breast and maintain latch.  LATCH Score: 5  Interventions Interventions: Breast feeding basics reviewed;Support pillows;Position options;Skin to skin;Breast massage;Hand express;Breast compression  Lactation Tools Discussed/Used Tools: Pump;Other (comment) (syringe) Breast pump type: Manual   Consult Status Consult Status: Follow-up Date: 12/25/16 Follow-up type: In-patient    Nguyen Todorov, Diamond Nickel 12/25/2016, 2:05  AM

## 2017-01-19 ENCOUNTER — Encounter (HOSPITAL_COMMUNITY): Payer: Self-pay | Admitting: Psychiatry

## 2017-01-19 ENCOUNTER — Ambulatory Visit (INDEPENDENT_AMBULATORY_CARE_PROVIDER_SITE_OTHER): Payer: Medicaid Other | Admitting: Psychiatry

## 2017-01-19 VITALS — BP 115/75 | HR 80 | Ht 62.0 in | Wt 129.0 lb

## 2017-01-19 DIAGNOSIS — F422 Mixed obsessional thoughts and acts: Secondary | ICD-10-CM | POA: Diagnosis not present

## 2017-01-19 DIAGNOSIS — F909 Attention-deficit hyperactivity disorder, unspecified type: Secondary | ICD-10-CM

## 2017-01-19 DIAGNOSIS — Z6379 Other stressful life events affecting family and household: Secondary | ICD-10-CM

## 2017-01-19 DIAGNOSIS — F411 Generalized anxiety disorder: Secondary | ICD-10-CM | POA: Diagnosis not present

## 2017-01-19 DIAGNOSIS — Z566 Other physical and mental strain related to work: Secondary | ICD-10-CM | POA: Diagnosis not present

## 2017-01-19 MED ORDER — ALPRAZOLAM 0.5 MG PO TABS: 0.5000 mg | ORAL_TABLET | Freq: Two times a day (BID) | ORAL | 2 refills | Status: DC | PRN

## 2017-01-19 MED ORDER — SERTRALINE HCL 100 MG PO TABS: 150.0000 mg | ORAL_TABLET | Freq: Every day | ORAL | 3 refills | Status: DC

## 2017-01-19 NOTE — Progress Notes (Signed)
Patient ID: Randel BooksRebecca S Tomkins, female   DOB: 1990/07/10, 26 y.o.   MRN: [id number] Patient ID: Randel BooksRebecca S Hanif, female   DOB: 1990/07/10, 26 y.o.   MRN: [id number] Patient ID: Randel BooksRebecca S Zarr, female   DOB: 1990/07/10, 26 y.o.   MRN: [id number] Patient ID: Randel BooksRebecca S Bihl, female   DOB: 1990/07/10, 26 y.o.   MRN: [id number] Patient ID: Randel BooksRebecca S Moskal, female   DOB: 1990/07/10, 26 y.o.   MRN: [id number] Patient ID: Randel BooksRebecca S Rill, female   DOB: 1990/07/10, 26 y.o.   MRN: [id number] Patient ID: Randel BooksRebecca S Tutterow, female   DOB: 1990/07/10, 26 y.o.   MRN: [id number] Patient ID: Randel BooksRebecca S Kilbourne, female   DOB: 1990/07/10, 26 y.o.   MRN: [id number] Patient ID: Randel BooksRebecca S Scala, female   DOB: 1990/07/10, 26 y.o.   MRN: [id number] Patient ID: Randel BooksRebecca S Mosquera, female   DOB: 1990/07/10, 26 y.o.   MRN: [id number] Patient ID: Randel BooksRebecca S Weis, female   DOB: 1990/07/10, 26 y.o.   MRN: [id number] Octaviano BattyRebecca S Madrigal1992/04/[birth year]  Subjective I have gotten more stressed and depressed lately  History of present illness. Patient is 26 year old Caucasian married  female who came for her followup appointment  Her job has been stressful. 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 She often has headaches and feels stressed and overwhelmed. She would like to have something to take for the stress. She had tried Xanax once before for tooth extraction and it really helped her feel calm She also has ADD however she does not have any symptoms of attention focus and concentration problems.  She does not feel that she requires medication for ADD.  Her OCD is much better with the medication.  She sleeping better.  She's not drinking or using any illegal substance.  Patient has not seen therapist for more than 2 months.  Patient admitted, she has busy schedule however she will like to followup with a therapist in the future.  The patient returns after a long absence. She  was last seen here 12 months ago. In the interim she had gotten pregnant and gone down to 25 mg of Zoloft only during pregnancy. She had her baby girl 3 weeks ago. Since delivery she's gone back up to Zoloft 100 mg daily. She is not breast-feeding because the baby wouldn't latch on and became very stressful. Now that she is in the postpartum. Her depression has come back. She's feeling low her energy is not very good. She is with her 26-year-old son as well as the baby throughout the day so it's hard for her to take naps. Her husband does help her as soon as he gets home from work. She states she is probably getting about 7 hours of sleep per day. Her OCD has come back and she is worrying about every little thing and having to touch things multiple times. She states that one night calms she gets exceedingly anxious for unknown reasons. She used to be on a low dose of Xanax and would like to restart this as well. She denies any thoughts of self harm towards herself or the baby and seems very positive about having the baby in her life  .  Vitals:   01/19/17 0831  BP: 115/75  Pulse: 80   Mental Status Examination Patient is well groomed well dressed female who appears to be in her stated age.She has The baby with her and seems very proud  of her. She states that her mood is depressed and she does appear to be somewhat tired   Her speech is fluent and coherent.  Her thought processes are logical linear and goal-directed.  There were no tremors or shakes.  She denies any active or passive suicidal thoughts or homicidal thoughts.  She denies any auditory or visual hallucination.  There were no paranoia or delusions present at this time.  Her psychomotor activity is normal She's alert and oriented x3.  Her insight judgment and impulse control is okay.  Current Medication  Current Outpatient Prescriptions:  .  fluticasone (FLONASE) 50 MCG/ACT nasal spray, Place 2 sprays into both nostrils daily. (Patient taking  differently: Place 2 sprays into both nostrils as needed. ), Disp: 16 g, Rfl: 6 .  ibuprofen (ADVIL,MOTRIN) 600 MG tablet, Take 1 tablet (600 mg total) by mouth every 6 (six) hours., Disp: 30 tablet, Rfl: 0 .  loratadine (CLARITIN) 10 MG tablet, Take 1 tablet (10 mg total) by mouth daily. (Patient taking differently: Take 10 mg by mouth daily as needed. ), Disp: 30 tablet, Rfl: 11 .  Multiple Vitamin (MULTIVITAMIN) capsule, Take 1 capsule by mouth daily., Disp: , Rfl:  .  polyethylene glycol (MIRALAX / GLYCOLAX) packet, Take 17 g by mouth 2 (two) times daily. , Disp: , Rfl:  .  sertraline (ZOLOFT) 100 MG tablet, Take 1.5 tablets (150 mg total) by mouth daily., Disp: 135 tablet, Rfl: 3 .  ALPRAZolam (XANAX) 0.5 MG tablet, Take 1 tablet (0.5 mg total) by mouth 2 (two) times daily as needed for anxiety or sleep., Disp: 60 tablet, Rfl: 2   Assessment Axis I obsessive-compulsive disorder, ADD by history Axis II deferred Axis III none Axis IV mild Axis V 70-75  Plan  The patient is a history of depression and OCD and ADD. Postpartum is generally a vulnerable. For the symptoms to recur. Since her OCD has worsened we'll increase Zoloft to 150 mg daily. She'll also restart Xanax at 0.5 mg twice a day as needed for anxiety. She'll return to see me in 4 weeks but if any of her symptoms worsen she may call  Diannia Ruder MD

## 2017-02-16 ENCOUNTER — Encounter (HOSPITAL_COMMUNITY): Payer: Self-pay | Admitting: Psychiatry

## 2017-02-16 ENCOUNTER — Ambulatory Visit (INDEPENDENT_AMBULATORY_CARE_PROVIDER_SITE_OTHER): Payer: Medicaid Other | Admitting: Psychiatry

## 2017-02-16 VITALS — BP 108/70 | HR 82 | Ht 62.0 in | Wt 126.0 lb

## 2017-02-16 DIAGNOSIS — Z818 Family history of other mental and behavioral disorders: Secondary | ICD-10-CM

## 2017-02-16 DIAGNOSIS — F909 Attention-deficit hyperactivity disorder, unspecified type: Secondary | ICD-10-CM

## 2017-02-16 DIAGNOSIS — R450 Nervousness: Secondary | ICD-10-CM | POA: Diagnosis not present

## 2017-02-16 DIAGNOSIS — Z810 Family history of intellectual disabilities: Secondary | ICD-10-CM | POA: Diagnosis not present

## 2017-02-16 DIAGNOSIS — F411 Generalized anxiety disorder: Secondary | ICD-10-CM

## 2017-02-16 DIAGNOSIS — Z811 Family history of alcohol abuse and dependence: Secondary | ICD-10-CM

## 2017-02-16 DIAGNOSIS — F339 Major depressive disorder, recurrent, unspecified: Secondary | ICD-10-CM | POA: Diagnosis not present

## 2017-02-16 DIAGNOSIS — R5383 Other fatigue: Secondary | ICD-10-CM

## 2017-02-16 DIAGNOSIS — Z975 Presence of (intrauterine) contraceptive device: Secondary | ICD-10-CM

## 2017-02-16 DIAGNOSIS — G4700 Insomnia, unspecified: Secondary | ICD-10-CM | POA: Diagnosis not present

## 2017-02-16 DIAGNOSIS — R5381 Other malaise: Secondary | ICD-10-CM

## 2017-02-16 DIAGNOSIS — F429 Obsessive-compulsive disorder, unspecified: Secondary | ICD-10-CM | POA: Diagnosis not present

## 2017-02-16 MED ORDER — ALPRAZOLAM 1 MG PO TABS: 1.0000 mg | ORAL_TABLET | Freq: Three times a day (TID) | ORAL | 2 refills | Status: DC | PRN

## 2017-02-16 MED ORDER — SERTRALINE HCL 100 MG PO TABS: ORAL_TABLET | ORAL | 3 refills | Status: DC

## 2017-02-16 NOTE — Progress Notes (Signed)
BH MD/PA/NP OP Progress Note  02/16/2017 11:50 AM Randel BooksRebecca S Clouse  MRN:  [id number]  Chief Complaint:  Chief Complaint    Depression; Anxiety; Follow-up     HPI:  Patient is 26 year old Caucasian married  female who came for her followup appointment  Her job has been stressful. 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 She often has headaches and feels stressed and overwhelmed. She would like to have something to take for the stress. She had tried Xanax once before for tooth extraction and it really helped her feel calm She also has ADD however she does not have any symptoms of attention focus and concentration problems.  She does not feel that she requires medication for ADD.  Her OCD is much better with the medication.  She sleeping better.  She's not drinking or using any illegal substance.  Patient has not seen therapist for more than 2 months.  Patient admitted, she has busy schedule however she will like to followup with a therapist in the future.  Patient returns after 4 weeks.  Her baby daughter is now 627 weeks old.  The patient states that in the last week she has begun to feel "horrible."  She is depressed and sad her energy has dropped and she cries all the time.  She has been having meltdowns and screaming at her husband.  One big stressor is that the fact that she is going to lose her health insurance soon and she does not know how she is going to pay for all her doctor visits.  Her husband works a lot and cannot provide much support.  She does not have any other family members to help except on weekends.  Her 26-year-old does not nap through the day so she does not get to rest and she is doing baby duty through the night and all day.  She is exhausted.  I told her to try any means necessary to get some more help.  She denies suicidal ideation and psychotic symptoms or any thoughts of harm to the herself or the baby.  In looking through  her records she was on a much higher dosage of Zoloft in the past so will go up to 250 mg.  She does not feel like the Xanax is helping so we will go up to 1 mg up to 3 times a day but be careful of sedation. Visit Diagnosis:    ICD-10-CM   1. Generalized anxiety disorder F41.1   2. Major depression, recurrent, chronic (HCC) F33.9     Past Psychiatric History: Several year outpatient treatment for OCD and ADHD  Past Medical History:  Past Medical History:  Diagnosis Date  . ADHD (attention deficit hyperactivity disorder)   . Anxiety   . Headache(784.0)   . Hx of pre-eclampsia in prior pregnancy, currently pregnant   . Obsessive-compulsive disorder   . Seizures (HCC)     Past Surgical History:  Procedure Laterality Date  . TONSILLECTOMY    . TOOTH EXTRACTION    . Tubes in Ears      Family Psychiatric History: See below  Family History:  Family History  Problem Relation Age of Onset  . Depression Maternal Grandfather   . Anxiety disorder Mother   . Hypertension Mother   . ADD / ADHD Brother   . ADD / ADHD Maternal Aunt   . Alcohol abuse Maternal Grandmother   . Bipolar disorder Maternal Grandmother   .  Breast cancer Maternal Grandmother   . Heart disease Maternal Grandmother   . Dementia Paternal Grandmother   . Hip fracture Paternal Grandmother   . Breast cancer Paternal Grandmother   . Seizures Cousin   . ADD / ADHD Maternal Aunt   . Drug abuse Neg Hx   . OCD Neg Hx   . Paranoid behavior Neg Hx   . Schizophrenia Neg Hx   . Sexual abuse Neg Hx   . Physical abuse Neg Hx     Social History:  Social History   Socioeconomic History  . Marital status: Married    Spouse name: None  . Number of children: None  . Years of education: None  . Highest education level: None  Social Needs  . Financial resource strain: None  . Food insecurity - worry: None  . Food insecurity - inability: None  . Transportation needs - medical: None  . Transportation needs -  non-medical: None  Occupational History  . None  Tobacco Use  . Smoking status: Never Smoker  . Smokeless tobacco: Never Used  Substance and Sexual Activity  . Alcohol use: Yes    Alcohol/week: 0.6 oz    Types: 1 Cans of beer per week    Comment: i bottle of beer once per week  . Drug use: No  . Sexual activity: Yes    Partners: Male    Birth control/protection: IUD  Other Topics Concern  . None  Social History Narrative  . None    Allergies: No Known Allergies  Metabolic Disorder Labs: No results found for: HGBA1C, MPG No results found for: PROLACTIN No results found for: CHOL, TRIG, HDL, CHOLHDL, VLDL, LDLCALC No results found for: TSH  Therapeutic Level Labs: No results found for: LITHIUM No results found for: VALPROATE No components found for:  CBMZ  Current Medications: Current Outpatient Medications  Medication Sig Dispense Refill  . fluticasone (FLONASE) 50 MCG/ACT nasal spray Place 2 sprays into both nostrils daily. (Patient taking differently: Place 2 sprays into both nostrils as needed. ) 16 g 6  . ibuprofen (ADVIL,MOTRIN) 600 MG tablet Take 1 tablet (600 mg total) by mouth every 6 (six) hours. 30 tablet 0  . loratadine (CLARITIN) 10 MG tablet Take 1 tablet (10 mg total) by mouth daily. (Patient taking differently: Take 10 mg by mouth daily as needed. ) 30 tablet 11  . Multiple Vitamin (MULTIVITAMIN) capsule Take 1 capsule by mouth daily.    . polyethylene glycol (MIRALAX / GLYCOLAX) packet Take 17 g by mouth 2 (two) times daily.     . sertraline (ZOLOFT) 100 MG tablet Take 2 and one half tablets daily 225 tablet 3  . ALPRAZolam (XANAX) 1 MG tablet Take 1 tablet (1 mg total) 3 (three) times daily as needed by mouth for anxiety. 90 tablet 2   No current facility-administered medications for this visit.      Musculoskeletal: Strength & Muscle Tone: within normal limits Gait & Station: normal Patient leans: N/A  Psychiatric Specialty Exam: Review of  Systems  Constitutional: Positive for malaise/fatigue.  Psychiatric/Behavioral: Positive for depression. The patient is nervous/anxious and has insomnia.   All other systems reviewed and are negative.   Blood pressure 108/70, pulse 82, height 5\' 2"  (1.575 m), weight 126 lb (57.2 kg), SpO2 98 %, unknown if currently breastfeeding.Body mass index is 23.05 kg/m.  General Appearance: Casual and Fairly Groomed  Eye Contact:  Good  Speech:  Clear and Coherent  Volume:  Normal  Mood:  Anxious and Depressed  Affect:  Constricted, Depressed and Tearful  Thought Process:  Goal Directed  Orientation:  Full (Time, Place, and Person)  Thought Content: Rumination   Suicidal Thoughts:  No  Homicidal Thoughts:  No  Memory:  Immediate;   Good Recent;   Good Remote;   Good  Judgement:  Good  Insight:  Fair  Psychomotor Activity:  Decreased  Concentration:  Concentration: Fair and Attention Span: Fair  Recall:  Good  Fund of Knowledge: Good  Language: Good  Akathisia:  No  Handed:  Right  AIMS (if indicated): not done  Assets:  Communication Skills Desire for Improvement Physical Health Resilience Social Support Talents/Skills  ADL's:  Intact  Cognition: WNL  Sleep:  Poor   Screenings: PHQ2-9     Office Visit from 07/13/2016 in [facility] Office Visit from 02/11/2016 in [facility]  PHQ-2 Total Score  0  0       Assessment and Plan: Patient is a 26 year old female with a history of depression OCD and ADHD.  She is now 7 weeks postpartum with her second child.  Her depressive symptoms have increased and although she is not suicidal I am concerned that things may worsen.  We will increase her Zoloft to 250 mg daily and also increase Xanax to 1 mg up to 3 times a day.  She claims that she cannot afford counseling here.  I urged her to find a support group for postpartum depression even if it is online.  She will return to see me in 3 weeks but  if she feels worse in the intervening time she is to call immediately.   Diannia Ruder, MD 02/16/2017, 11:50 AM

## 2017-03-09 ENCOUNTER — Ambulatory Visit (HOSPITAL_COMMUNITY): Payer: Self-pay | Admitting: Psychiatry

## 2017-04-06 DIAGNOSIS — Z30431 Encounter for routine checking of intrauterine contraceptive device: Secondary | ICD-10-CM | POA: Diagnosis not present

## 2017-04-12 ENCOUNTER — Encounter (HOSPITAL_COMMUNITY): Payer: Self-pay | Admitting: Psychiatry

## 2017-04-12 ENCOUNTER — Ambulatory Visit (INDEPENDENT_AMBULATORY_CARE_PROVIDER_SITE_OTHER): Payer: Self-pay | Admitting: Psychiatry

## 2017-04-12 VITALS — BP 109/74 | HR 92 | Ht 62.0 in | Wt 123.0 lb

## 2017-04-12 DIAGNOSIS — Z975 Presence of (intrauterine) contraceptive device: Secondary | ICD-10-CM

## 2017-04-12 DIAGNOSIS — Z818 Family history of other mental and behavioral disorders: Secondary | ICD-10-CM

## 2017-04-12 DIAGNOSIS — F411 Generalized anxiety disorder: Secondary | ICD-10-CM

## 2017-04-12 DIAGNOSIS — Z811 Family history of alcohol abuse and dependence: Secondary | ICD-10-CM

## 2017-04-12 DIAGNOSIS — F339 Major depressive disorder, recurrent, unspecified: Secondary | ICD-10-CM

## 2017-04-12 DIAGNOSIS — Z810 Family history of intellectual disabilities: Secondary | ICD-10-CM

## 2017-04-12 MED ORDER — SERTRALINE HCL 100 MG PO TABS: ORAL_TABLET | ORAL | 3 refills | Status: DC

## 2017-04-12 MED ORDER — ALPRAZOLAM 0.5 MG PO TABS: 0.5000 mg | ORAL_TABLET | Freq: Two times a day (BID) | ORAL | 2 refills | Status: DC | PRN

## 2017-04-12 NOTE — Progress Notes (Signed)
BH MD/PA/NP OP Progress Note  04/12/2017 11:46 AM Theresa Jackson  MRN:  [id number]  Chief Complaint:  Chief Complaint    Depression; Anxiety; Follow-up     HPI: This patient is a 27 year old married white female who lives with her husband 32-year-old son and 51-month-old daughter.  She returns for follow-up for treatment of depression and anxiety.  The patient returns after 2 months.  Last time her depression and anxiety had worsened considerably postpartum.  She was very tired and over extended with the new baby and not much help.  I did increase her Zoloft to 250 mg daily which is congruent with her previous dose before pregnancy and she seems to be doing better.  I also increase the Xanax to 1 mg 3 times a day because of her severe anxiety.  However she states that this made her depression worse and with the help of a nurse friend she tapered this off to 0.5 mg twice a day.  She is doing well on this current dosage.  She is still not getting quite enough sleep because of the baby schedule but she is handling it better.  She is brighter and having much fewer crying spells.  Her husband helps her as much as he is able. Visit Diagnosis:    ICD-10-CM   1. Generalized anxiety disorder F41.1   2. Major depression, recurrent, chronic (HCC) F33.9     Past Psychiatric History: Several year outpatient treatment for OCD and ADHD  Past Medical History:  Past Medical History:  Diagnosis Date  . ADHD (attention deficit hyperactivity disorder)   . Anxiety   . Headache(784.0)   . Hx of pre-eclampsia in prior pregnancy, currently pregnant   . Obsessive-compulsive disorder   . Seizures (HCC)     Past Surgical History:  Procedure Laterality Date  . TONSILLECTOMY    . TOOTH EXTRACTION    . Tubes in Ears      Family Psychiatric History: See below  Family History:  Family History  Problem Relation Age of Onset  . Depression Maternal Grandfather   . Anxiety disorder Mother   . Hypertension  Mother   . ADD / ADHD Brother   . ADD / ADHD Maternal Aunt   . Alcohol abuse Maternal Grandmother   . Bipolar disorder Maternal Grandmother   . Breast cancer Maternal Grandmother   . Heart disease Maternal Grandmother   . Dementia Paternal Grandmother   . Hip fracture Paternal Grandmother   . Breast cancer Paternal Grandmother   . Seizures Cousin   . ADD / ADHD Maternal Aunt   . Drug abuse Neg Hx   . OCD Neg Hx   . Paranoid behavior Neg Hx   . Schizophrenia Neg Hx   . Sexual abuse Neg Hx   . Physical abuse Neg Hx     Social History:  Social History   Socioeconomic History  . Marital status: Married    Spouse name: None  . Number of children: None  . Years of education: None  . Highest education level: None  Social Needs  . Financial resource strain: None  . Food insecurity - worry: None  . Food insecurity - inability: None  . Transportation needs - medical: None  . Transportation needs - non-medical: None  Occupational History  . None  Tobacco Use  . Smoking status: Never Smoker  . Smokeless tobacco: Never Used  Substance and Sexual Activity  . Alcohol use: Yes    Alcohol/week: 0.6 oz  Types: 1 Cans of beer per week    Comment: i bottle of beer once per week  . Drug use: No  . Sexual activity: Yes    Partners: Male    Birth control/protection: IUD  Other Topics Concern  . None  Social History Narrative  . None    Allergies: No Known Allergies  Metabolic Disorder Labs: No results found for: HGBA1C, MPG No results found for: PROLACTIN No results found for: CHOL, TRIG, HDL, CHOLHDL, VLDL, LDLCALC No results found for: TSH  Therapeutic Level Labs: No results found for: LITHIUM No results found for: VALPROATE No components found for:  CBMZ  Current Medications: Current Outpatient Medications  Medication Sig Dispense Refill  . fluticasone (FLONASE) 50 MCG/ACT nasal spray Place 2 sprays into both nostrils daily. (Patient taking differently: Place 2  sprays into both nostrils as needed. ) 16 g 6  . ibuprofen (ADVIL,MOTRIN) 600 MG tablet Take 1 tablet (600 mg total) by mouth every 6 (six) hours. 30 tablet 0  . loratadine (CLARITIN) 10 MG tablet Take 1 tablet (10 mg total) by mouth daily. (Patient taking differently: Take 10 mg by mouth daily as needed. ) 30 tablet 11  . Multiple Vitamin (MULTIVITAMIN) capsule Take 1 capsule by mouth daily.    . polyethylene glycol (MIRALAX / GLYCOLAX) packet Take 17 g by mouth 2 (two) times daily.     . sertraline (ZOLOFT) 100 MG tablet Take 2 and one half tablets daily 225 tablet 3  . ALPRAZolam (XANAX) 0.5 MG tablet Take 1 tablet (0.5 mg total) by mouth 2 (two) times daily as needed for anxiety or sleep. 60 tablet 2   No current facility-administered medications for this visit.      Musculoskeletal: Strength & Muscle Tone: within normal limits Gait & Station: normal Patient leans: N/A  Psychiatric Specialty Exam: Review of Systems  All other systems reviewed and are negative.   Blood pressure 109/74, pulse 92, height 5\' 2"  (1.575 m), weight 123 lb (55.8 kg), SpO2 99 %, unknown if currently breastfeeding.Body mass index is 22.5 kg/m.  General Appearance: Casual and Fairly Groomed  Eye Contact:  Good  Speech:  Clear and Coherent  Volume:  Normal  Mood:  Euthymic  Affect:  Congruent  Thought Process:  Goal Directed  Orientation:  Full (Time, Place, and Person)  Thought Content: WDL   Suicidal Thoughts:  No  Homicidal Thoughts:  No  Memory:  Immediate;   Good Recent;   Good Remote;   Good  Judgement:  Good  Insight:  Fair  Psychomotor Activity:  Normal  Concentration:  Concentration: Fair and Attention Span: Fair  Recall:  Good  Fund of Knowledge: Good  Language: Good  Akathisia:  No  Handed:  Right  AIMS (if indicated): not done  Assets:  Communication Skills Desire for Improvement Physical Health Resilience Social Support Talents/Skills  ADL's:  Intact  Cognition: WNL  Sleep:   Good   Screenings: PHQ2-9     Office Visit from 07/13/2016 in [facility] Office Visit from 02/11/2016 in [facility]  PHQ-2 Total Score  0  0       Assessment and Plan: Patient is a 27 year old female with a history of depression and anxiety, both of which were worsened postpartum.  Now that she is on the higher dose of Zoloft she seems to be doing much better so she will continue 250 mg daily.  For now she can continue the Xanax 0.5  mg twice daily but eventually she would like to come off of it and we can do this after she gets further through the postpartum period and her baby is sleeping through the night.  She will return to see me in 2 months   Diannia Ruder, MD 04/12/2017, 11:46 AM

## 2017-05-10 DIAGNOSIS — Z30431 Encounter for routine checking of intrauterine contraceptive device: Secondary | ICD-10-CM | POA: Diagnosis not present

## 2017-05-10 DIAGNOSIS — Z3202 Encounter for pregnancy test, result negative: Secondary | ICD-10-CM | POA: Diagnosis not present

## 2017-05-11 IMAGING — US US PELVIS COMPLETE
1 series · 15 of 25 positions shown · non-contrast
Comparison: None

CLINICAL DATA: Sharp pelvic pain, onset last night.



[Series 1: us pelvis complete · 31 acquisitions, 15 frames shown]
[im 1/31]
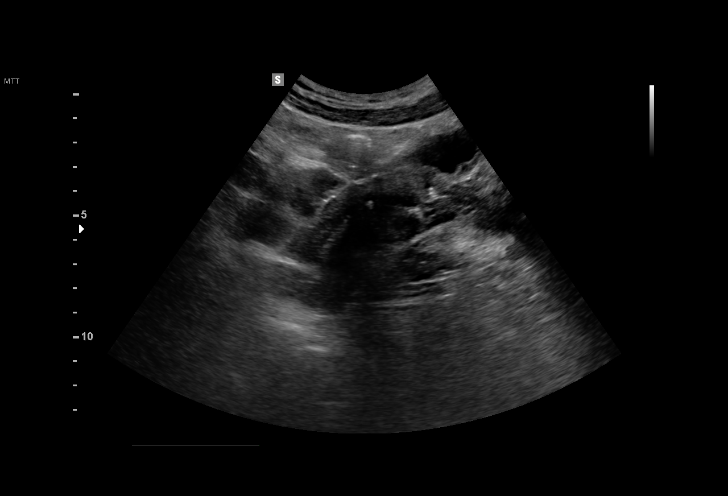
[im 3/31]
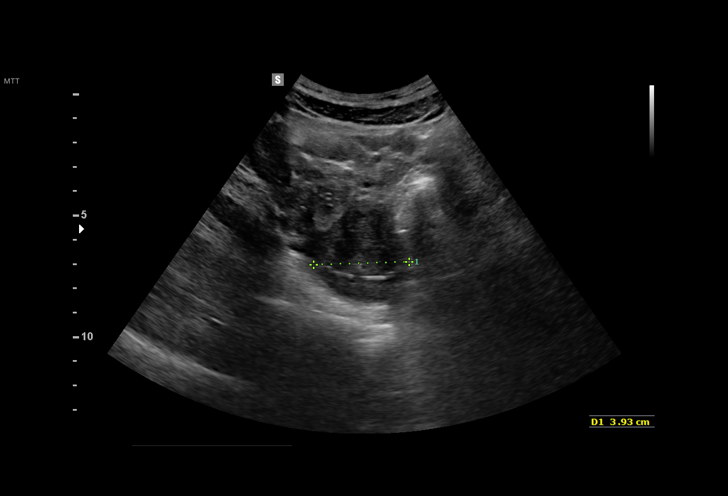
[im 6/31]
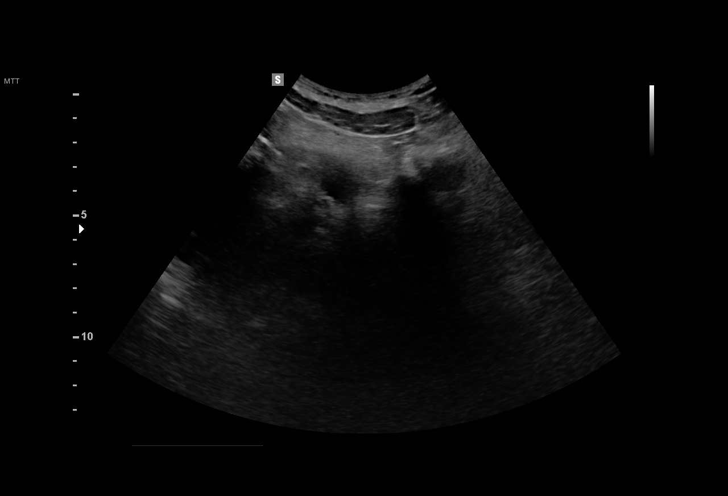
[im 7/31]
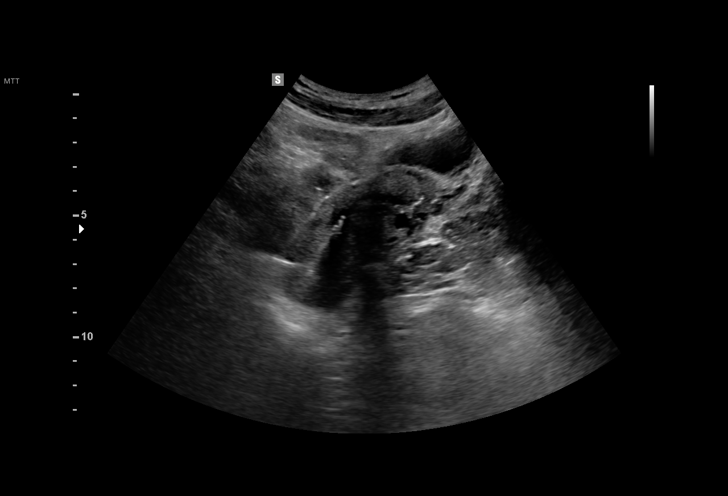
[im 9/31]
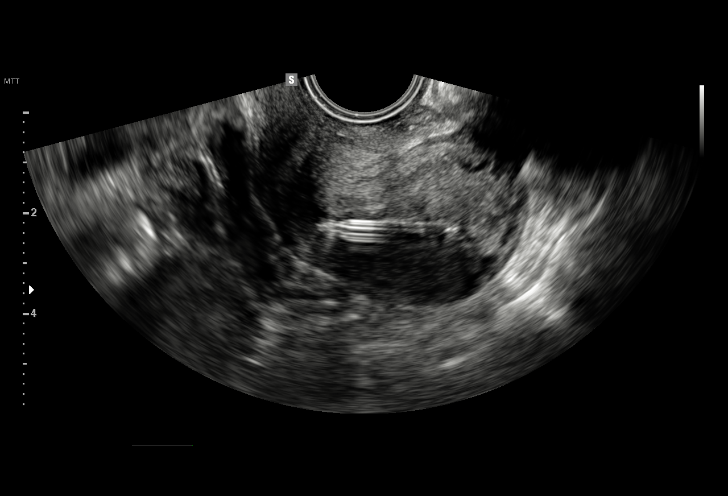
[im 12/31]
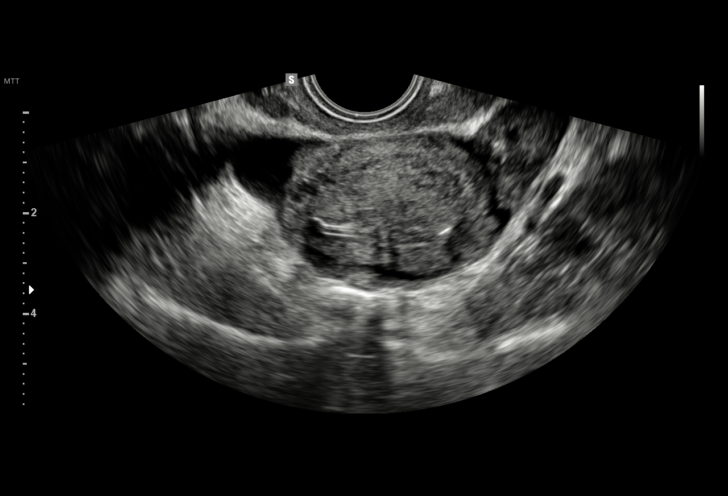
[im 13/31]
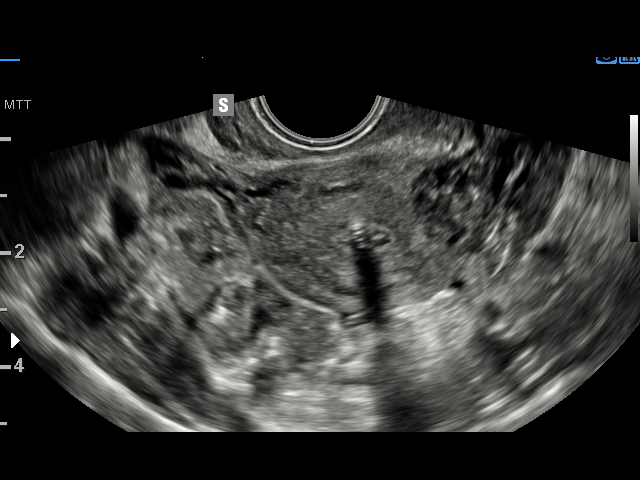
[im 16/31]
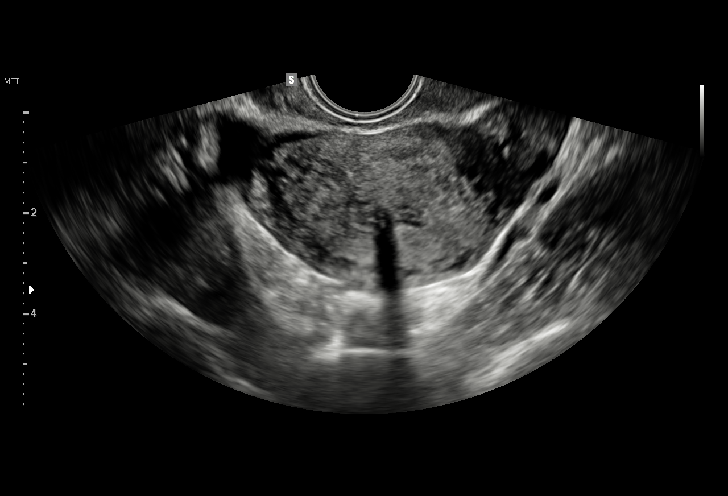
[im 18/31]
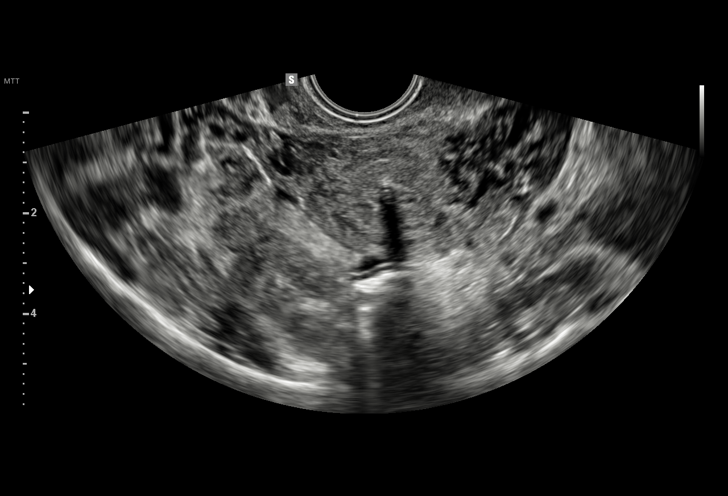
[im 19/31]
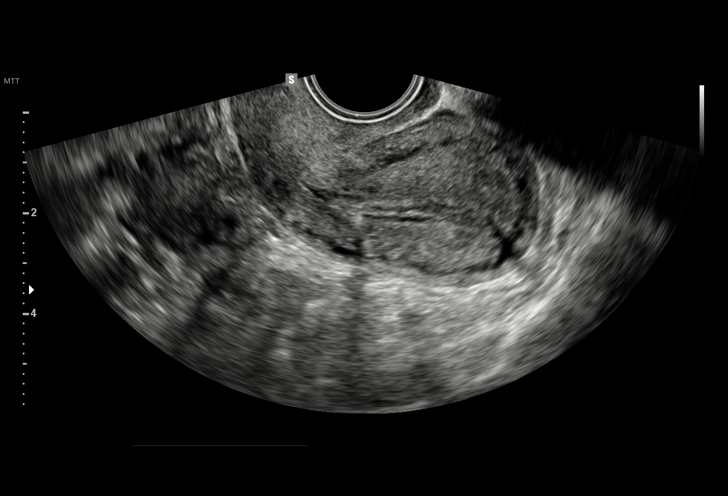
[im 22/31]
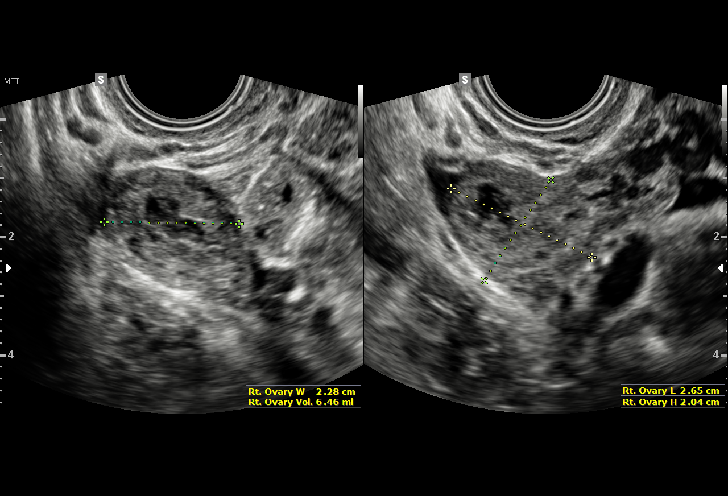
[im 24/31]
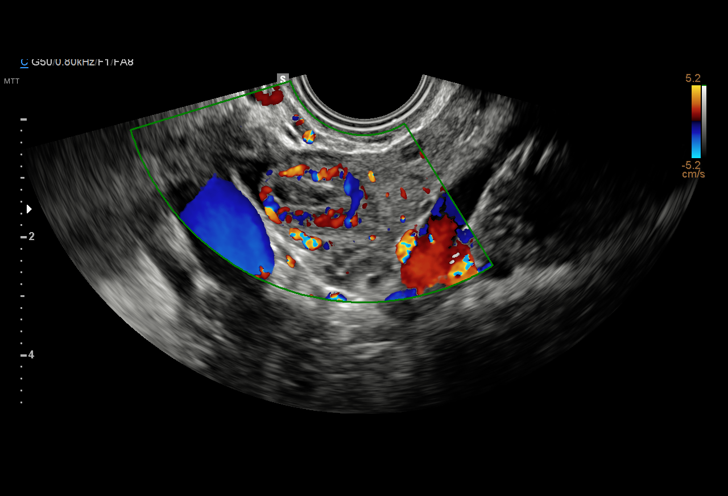
[im 26/31]
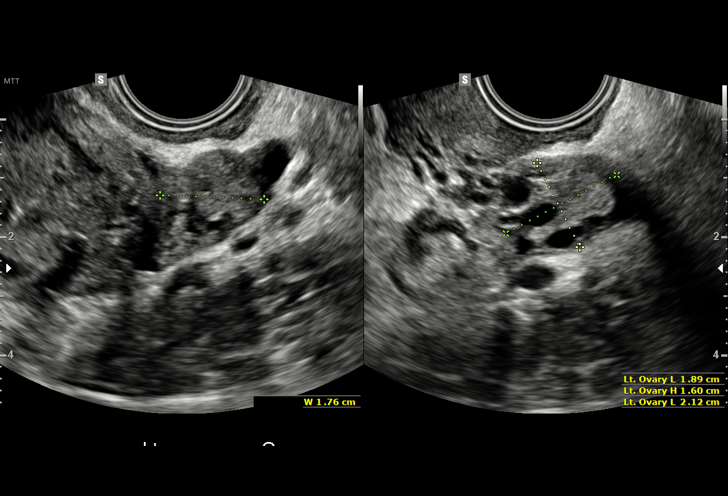
[im 28/31]
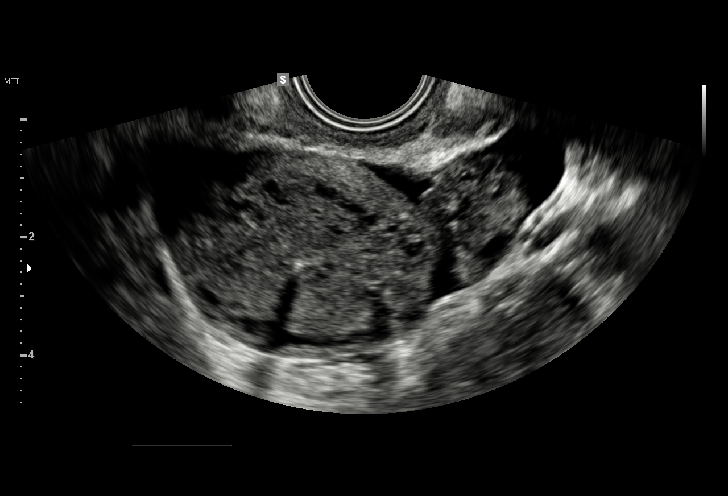
[im 31/31]
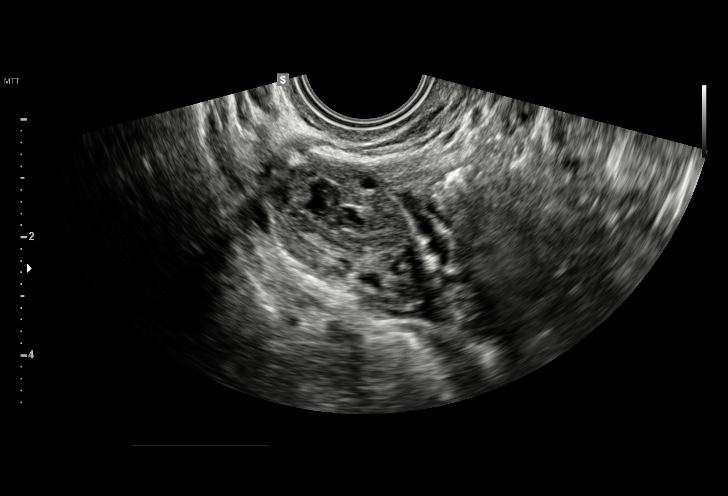

[15 of 25 positions shown; findings below may reference images not displayed]

FINDINGS: Uterus

Measurements: 5.6 x 3.6 x 4.2 cm. No fibroids or other mass
visualized. IUD appears appropriately positioned.

Endometrium

Thickness: 5.6 mm.  No focal abnormality visualized.

Right ovary

Measurements: 2.7 x 2.0 x 2.3 cm. Normal appearance/no adnexal mass.

Left ovary

Measurements: 1.9 x 1.6 x 1.8 cm. Normal appearance/no adnexal mass.

Other findings

Moderate volume free pelvic fluid.
IMPRESSION: Satisfactorily positioned IUD. Normal uterus and ovaries. Moderate
volume free pelvic fluid.

## 2017-05-27 DIAGNOSIS — N7689 Other specified inflammation of vagina and vulva: Secondary | ICD-10-CM | POA: Diagnosis not present

## 2017-05-27 DIAGNOSIS — N390 Urinary tract infection, site not specified: Secondary | ICD-10-CM | POA: Diagnosis not present

## 2017-06-23 DIAGNOSIS — L02426 Furuncle of left lower limb: Secondary | ICD-10-CM | POA: Diagnosis not present

## 2017-06-23 DIAGNOSIS — L02425 Furuncle of right lower limb: Secondary | ICD-10-CM | POA: Diagnosis not present

## 2017-06-23 DIAGNOSIS — B9689 Other specified bacterial agents as the cause of diseases classified elsewhere: Secondary | ICD-10-CM | POA: Diagnosis not present

## 2017-07-12 ENCOUNTER — Ambulatory Visit (HOSPITAL_COMMUNITY): Payer: Self-pay | Admitting: Psychiatry

## 2017-08-01 DIAGNOSIS — K5904 Chronic idiopathic constipation: Secondary | ICD-10-CM | POA: Diagnosis not present

## 2017-08-01 DIAGNOSIS — R1013 Epigastric pain: Secondary | ICD-10-CM | POA: Diagnosis not present

## 2018-01-03 ENCOUNTER — Ambulatory Visit: Payer: BLUE CROSS/BLUE SHIELD | Admitting: Nurse Practitioner

## 2018-01-03 ENCOUNTER — Encounter: Payer: Self-pay | Admitting: Nurse Practitioner

## 2018-01-03 VITALS — BP 105/76 | HR 80 | Temp 97.3°F | Ht 62.0 in | Wt 125.0 lb

## 2018-01-03 DIAGNOSIS — B081 Molluscum contagiosum: Secondary | ICD-10-CM | POA: Diagnosis not present

## 2018-01-03 NOTE — Progress Notes (Signed)
   Subjective:    Patient ID: Theresa Jackson, female    DOB: July 22, 1990, 27 y.o.   MRN: [id number]   Chief Complaint: Red spots on arm and back   HPI Patient comes in today c/o rash scattered in different areas. Areas itch but do not hurt or burn. Started last week and has spread.   Review of Systems  Constitutional: Negative for chills, fatigue and fever.  HENT: Negative.   Respiratory: Negative.   Gastrointestinal: Negative.   Skin: Positive for rash.  Psychiatric/Behavioral: Negative.        Objective:   Physical Exam  Constitutional: She is oriented to person, place, and time. She appears well-developed and well-nourished. No distress.  Cardiovascular: Normal rate.  Pulmonary/Chest: Effort normal.  Abdominal: Soft.  Musculoskeletal: Normal range of motion.  Neurological: She is alert and oriented to person, place, and time.  Skin: Skin is warm. Rash noted.  Erythematous umbilicated  Lesions scattred on right upper arm, right lower back , right and left thigh  Psychiatric: She has a normal mood and affect. Her behavior is normal. Thought content normal.  Nursing note and vitals reviewed.     BP 105/76   Pulse 80   Temp (!) 97.3 F (36.3 C) (Oral)   Ht 5\' 2"  (1.575 m)   Wt 125 lb (56.7 kg)   BMI 22.86 kg/m        Assessment & Plan:  Theresa Jackson in today with chief complaint of Red spots on arm and back   1. Molluscum contagiosum infection Viral and will run its course. If not improving let me know.  Mary-Margaret Daphine Deutscher, FNP

## 2018-01-03 NOTE — Patient Instructions (Signed)
Molluscum Contagiosum, Adult Molluscum contagiosum is a skin infection that can cause a rash. When molluscum contagiosum affects the genital area, it is called a sexually transmitted disease (STD). What are the causes? Molluscum contagiosum is caused by a virus. The virus can spread easily from person to person through:  Skin-to-skin contact with an infected person.  Contact with an infected object, such as a towel or clothing.  What increases the risk? You may be at higher risk for molluscum contagiosum if you:  Live in an area where the weather is moist and warm.  Have a weak body defense system (immune system).  What are the signs or symptoms? The main symptom is a rash that appears 2-7 weeks after exposure to the virus. It is made up of 2-20 small, firm, dome-shaped bumps that may:  Be pink or flesh-colored.  Appear alone or in groups.  Range from the size of a pinhead to the size of a pencil eraser.  Feel smooth and waxy.  Have a pit in the middle.  Itch. The rash does not itch for most people.  The bumps often appears on the genitals, thighs, face, neck, and abdomen. How is this diagnosed? A health care provider can usually diagnose molluscum contagiosum by looking at the bumps on your skin. To confirm the diagnosis, your health care provider may scrape the bumps to collect a skin sample to examine under a microscope. How is this treated? The bumps may go away on their own, but people often have treatment to keep the virus from infecting someone else or to keep the rash from spreading to other body parts. Treatment may include:  Surgery to remove the bumps by freezing them (cryosurgery).  A procedure to scrape off the bumps (curettage).  A procedure to remove the bumps with a laser.  Putting medicine on the bumps (topical treatment).  Sometimes no treatment is needed. Follow these instructions at home:  Take medicines only as directed by your health care  provider.  As long as you have bumps on your skin, the infection can spread to others and to other parts of your body. To prevent this from happening: ? Do not scratch the bumps. ? Do not share clothing or towels with others until the bumps disappear. ? Avoid close contact with others until the bumps disappear. This includes sexual contact. ? Wash your hands often. ? Cover the bumps with clothing or a bandage when you will be near other people. Contact a health care provider if:  The bumps are spreading.  The bumps are becoming red and sore.  The bumps have not gone away after 12 months. This information is not intended to replace advice given to you by your health care provider. Make sure you discuss any questions you have with your health care provider. Document Released: 10/17/2013 Document Revised: 08/28/2015 Document Reviewed: 08/29/2013 Elsevier Interactive Patient Education  2018 [employer]vier Inc[employer]  

## 2018-01-04 ENCOUNTER — Ambulatory Visit (INDEPENDENT_AMBULATORY_CARE_PROVIDER_SITE_OTHER): Payer: BLUE CROSS/BLUE SHIELD | Admitting: Family Medicine

## 2018-01-04 ENCOUNTER — Encounter: Payer: Self-pay | Admitting: Family Medicine

## 2018-01-04 VITALS — BP 111/76 | HR 88 | Temp 97.0°F | Ht 62.0 in | Wt 125.0 lb

## 2018-01-04 DIAGNOSIS — R21 Rash and other nonspecific skin eruption: Secondary | ICD-10-CM | POA: Diagnosis not present

## 2018-01-04 DIAGNOSIS — W57XXXA Bitten or stung by nonvenomous insect and other nonvenomous arthropods, initial encounter: Secondary | ICD-10-CM | POA: Diagnosis not present

## 2018-01-04 MED ORDER — BETAMETHASONE SOD PHOS & ACET 6 (3-3) MG/ML IJ SUSP
6.0000 mg | Freq: Once | INTRAMUSCULAR | Status: DC
Start: 2018-01-04 — End: 2023-07-02

## 2018-01-04 NOTE — Progress Notes (Signed)
Chief Complaint  Patient presents with  . Rash on back neck and abdoment    Saw MMM yesterday and wants second opinion    HPI  Patient presents today for recheck of rash.  It is still itching.  Patient is worried that it could be bedbugs.  She says that her husband and children do not have any similar rash.  Her husband and son sleep in the same bed with her.  She read that bedbug bites have a red center similar to hers so she thought it should be rechecked.  It has spread somewhat, but not really since yesterday.  She is been putting cortisone on it with very little improvement.  Note from yesterday reviewed.  PMH: Smoking status noted ROS: Per HPI  Objective: BP 111/76   Pulse 88   Temp (!) 97 F (36.1 C) (Oral)   Ht 5\' 2"  (1.575 m)   Wt 125 lb (56.7 kg)   BMI 22.86 kg/m  Gen: NAD, alert, cooperative with exam HEENT: NCAT, EOMI, PERRL Ext: No edema, warm.  Lesions are similar to the one in yesterday's picture.  The somewhat more erythematous than would be indicated in the picture.  A few have a vesicular center.  Most measure 2 to 4 mm there are couple as big as 8 mm.  They are scattered over the trunk with a small number on the right upper arm and right proximal thigh. Neuro: Alert and oriented, No gross deficits  Assessment and plan:  Bug bites versus mollusca.  Either way cortisone should help.  No orders of the defined types were placed in this encounter.   No orders of the defined types were placed in this encounter.   Follow up as needed.  Mechele Claude, MD

## 2018-01-09 DIAGNOSIS — L209 Atopic dermatitis, unspecified: Secondary | ICD-10-CM | POA: Diagnosis not present

## 2018-02-27 DIAGNOSIS — Z01419 Encounter for gynecological examination (general) (routine) without abnormal findings: Secondary | ICD-10-CM | POA: Diagnosis not present

## 2018-02-27 DIAGNOSIS — Z6822 Body mass index (BMI) 22.0-22.9, adult: Secondary | ICD-10-CM | POA: Diagnosis not present

## 2018-12-19 ENCOUNTER — Other Ambulatory Visit: Payer: Self-pay

## 2018-12-19 DIAGNOSIS — Z20822 Contact with and (suspected) exposure to covid-19: Secondary | ICD-10-CM

## 2018-12-19 DIAGNOSIS — R6889 Other general symptoms and signs: Secondary | ICD-10-CM | POA: Diagnosis not present

## 2018-12-20 LAB — NOVEL CORONAVIRUS, NAA: SARS-CoV-2, NAA: NOT DETECTED

## 2019-01-02 ENCOUNTER — Other Ambulatory Visit: Payer: Self-pay | Admitting: *Deleted

## 2019-01-02 DIAGNOSIS — Z20822 Contact with and (suspected) exposure to covid-19: Secondary | ICD-10-CM

## 2019-01-02 DIAGNOSIS — R6889 Other general symptoms and signs: Secondary | ICD-10-CM | POA: Diagnosis not present

## 2019-01-04 LAB — NOVEL CORONAVIRUS, NAA: SARS-CoV-2, NAA: NOT DETECTED

## 2019-01-18 ENCOUNTER — Other Ambulatory Visit: Payer: Self-pay

## 2019-01-18 DIAGNOSIS — Z20822 Contact with and (suspected) exposure to covid-19: Secondary | ICD-10-CM

## 2019-01-19 LAB — NOVEL CORONAVIRUS, NAA: SARS-CoV-2, NAA: NOT DETECTED

## 2019-03-05 DIAGNOSIS — Z01419 Encounter for gynecological examination (general) (routine) without abnormal findings: Secondary | ICD-10-CM | POA: Diagnosis not present

## 2019-03-05 DIAGNOSIS — Z113 Encounter for screening for infections with a predominantly sexual mode of transmission: Secondary | ICD-10-CM | POA: Diagnosis not present

## 2019-03-05 DIAGNOSIS — Z6821 Body mass index (BMI) 21.0-21.9, adult: Secondary | ICD-10-CM | POA: Diagnosis not present

## 2019-03-20 ENCOUNTER — Ambulatory Visit: Payer: BC Managed Care – PPO | Attending: Internal Medicine

## 2019-03-20 ENCOUNTER — Other Ambulatory Visit: Payer: Self-pay

## 2019-03-20 DIAGNOSIS — Z20828 Contact with and (suspected) exposure to other viral communicable diseases: Secondary | ICD-10-CM | POA: Diagnosis not present

## 2019-03-20 DIAGNOSIS — Z20822 Contact with and (suspected) exposure to covid-19: Secondary | ICD-10-CM

## 2019-03-21 LAB — NOVEL CORONAVIRUS, NAA: SARS-CoV-2, NAA: NOT DETECTED

## 2019-03-24 ENCOUNTER — Telehealth: Payer: Self-pay

## 2019-03-24 ENCOUNTER — Ambulatory Visit
Admission: EM | Admit: 2019-03-24 | Discharge: 2019-03-24 | Disposition: A | Discharge status: Home / Self Care | Payer: BC Managed Care – PPO | Attending: Emergency Medicine | Admitting: Emergency Medicine

## 2019-03-24 ENCOUNTER — Telehealth: Payer: Self-pay | Admitting: Emergency Medicine

## 2019-03-24 ENCOUNTER — Other Ambulatory Visit: Payer: Self-pay

## 2019-03-24 DIAGNOSIS — R1010 Upper abdominal pain, unspecified: Secondary | ICD-10-CM | POA: Diagnosis not present

## 2019-03-24 DIAGNOSIS — R1030 Lower abdominal pain, unspecified: Secondary | ICD-10-CM | POA: Diagnosis not present

## 2019-03-24 LAB — POCT URINALYSIS DIP (MANUAL ENTRY)
Bilirubin, UA: NEGATIVE
Glucose, UA: NEGATIVE mg/dL
Ketones, POC UA: NEGATIVE mg/dL
Leukocytes, UA: NEGATIVE
Nitrite, UA: NEGATIVE
Protein Ur, POC: NEGATIVE mg/dL
Spec Grav, UA: 1.01 (ref 1.010–1.025)
Urobilinogen, UA: 0.2 E.U./dL
pH, UA: 6.5 (ref 5.0–8.0)

## 2019-03-24 LAB — POCT URINE PREGNANCY: Preg Test, Ur: NEGATIVE

## 2019-03-24 MED ORDER — DICYCLOMINE HCL 20 MG PO TABS: 20.0000 mg | ORAL_TABLET | Freq: Two times a day (BID) | ORAL | 0 refills | Status: DC

## 2019-03-24 MED ORDER — ONDANSETRON HCL 4 MG PO TABS: 4.0000 mg | ORAL_TABLET | Freq: Three times a day (TID) | ORAL | 0 refills | Status: DC | PRN

## 2019-03-24 NOTE — Telephone Encounter (Signed)
Patient called and requested Zofran to be sent to the pharmacy on file. Medication was resent

## 2019-03-24 NOTE — Discharge Instructions (Addendum)
UA is negative. We will send for culture.  Urine pregnancy test is negative  Get rest and drink fluids Zofran prescribed.  Take as directed.    DIET Instructions:  30 minutes after taking nausea medicine, begin with sips of clear liquids. If able to hold down 2 - 4 ounces for 30 minutes, begin drinking more. Increase your fluid intake to replace losses. Clear liquids only for 24 hours (water, tea, sport drinks, clear flat ginger ale or cola and juices, broth, jello, popsicles, ect). Advance to bland foods, applesauce, rice, baked or boiled chicken, ect. Avoid milk, greasy foods and anything that doesnt agree with you.  If you experience new or worsening symptoms return or go to ER such as fever, chills, nausea, vomiting, diarrhea, bloody or dark tarry stools, constipation, urinary symptoms, worsening abdominal discomfort, symptoms that do not improve with medications, inability to keep fluids down, etc...  Reviewed expectations re: course of current medical issues. Questions answered. Outlined signs and symptoms indicating need for more acute intervention. Patient verbalized understanding. After Visit Summary given.

## 2019-03-24 NOTE — ED Provider Notes (Addendum)
[facility]    CSN: [id number] Arrival date & time: 03/24/19  1109      History   Chief Complaint Chief Complaint  Patient presents with  . Abdominal Pain  . Nausea    HPI Theresa Jackson is a 28 y.o. female.   Theresa Jackson 28 years old female presented to the urgent care with a complaint abdominal pain that started 5 days ago.  Denies a precipitating event, or specific injury.  Patient localizes pain to all abdominal quadrants.  Describes it as constant and achy in character.  She rated pain at 4 on a scale of 1-10.  Has tried OTC medications without relief.  Symptoms are made worse with eating and and she vomited one time.  When symptoms started she went to get tested for COVID-19.  Her result was negative.  She doesn't reports similar symptoms in the past.  Denies fever, chills, appetite change, weight change, chest pain, nausea, vomiting, changes in bowel or bladder habits.  The history is provided by the patient. No language interpreter was used.  Abdominal Pain   Past Medical History:  Diagnosis Date  . ADHD (attention deficit hyperactivity disorder)   . Anxiety   . Headache(784.0)   . Hx of pre-eclampsia in prior pregnancy, currently pregnant   . Obsessive-compulsive disorder   . Seizures [facility])     Patient Active Problem List   Diagnosis Date Noted  . Normal pregnancy 12/24/2016  . SVD (spontaneous vaginal delivery) 12/24/2016  . Indication for care in labor or delivery 12/16/2013  . Otitis media 09/11/2012  . Sore throat 09/11/2012  . ADHD (attention deficit hyperactivity disorder) 06/16/2012  . OCD (obsessive compulsive disorder) 06/16/2012  . Generalized anxiety disorder 06/16/2012  . Family history of benign essential tremor 06/16/2012    Past Surgical History:  Procedure Laterality Date  . TONSILLECTOMY    . TOOTH EXTRACTION    . Tubes in Ears      OB History    Gravida  2   Para  2   Term  2   Preterm      AB    Living  2     SAB      TAB      Ectopic      Multiple  0   Live Births  2            Home Medications    Prior to Admission medications   Medication Sig Start Date End Date Taking? Authorizing Provider  dicyclomine (BENTYL) 20 MG tablet Take 1 tablet (20 mg total) by mouth 2 (two) times daily. 03/24/19   Char Feltman, Darrelyn Hillock, FNP  ibuprofen (ADVIL,MOTRIN) 600 MG tablet Take 1 tablet (600 mg total) by mouth every 6 (six) hours. 12/25/16   Marylynn Pearson, MD  linaclotide [facility]) 290 MCG CAPS capsule Take 290 mcg by mouth daily before breakfast.    [provider]  Multiple Vitamin (MULTIVITAMIN) capsule Take 1 capsule by mouth daily.    [provider]  ondansetron (ZOFRAN) 4 MG tablet Take 1 tablet (4 mg total) by mouth every 8 (eight) hours as needed for nausea or vomiting. 03/24/19   Ruari Mudgett, Darrelyn Hillock, FNP  sertraline (ZOLOFT) 100 MG tablet Take 2 and one half tablets daily 04/12/17   Cloria Spring, MD    Family History Family History  Problem Relation Age of Onset  . Depression Maternal Grandfather   . Anxiety disorder Mother   . Hypertension Mother   .  ADD / ADHD Brother   . ADD / ADHD Maternal Aunt   . Alcohol abuse Maternal Grandmother   . Bipolar disorder Maternal Grandmother   . Breast cancer Maternal Grandmother   . Heart disease Maternal Grandmother   . Dementia Paternal Grandmother   . Hip fracture Paternal Grandmother   . Breast cancer Paternal Grandmother   . Seizures Cousin   . ADD / ADHD Maternal Aunt   . Drug abuse Neg Hx   . OCD Neg Hx   . Paranoid behavior Neg Hx   . Schizophrenia Neg Hx   . Sexual abuse Neg Hx   . Physical abuse Neg Hx     Social History Social History   Tobacco Use  . Smoking status: Never Smoker  . Smokeless tobacco: Never Used  Substance Use Topics  . Alcohol use: Yes    Alcohol/week: 1.0 standard drinks    Types: 1 Cans of beer per week    Comment: i bottle of beer once per week  . Drug  use: No     Allergies   Patient has no known allergies.   Review of Systems Review of Systems  Constitutional: Negative.   HENT: Negative.   Respiratory: Negative.   Cardiovascular: Negative.   Gastrointestinal: Positive for abdominal pain.  Genitourinary: Negative.   ROS:" All other are negatives  Physical Exam Triage Vital Signs ED Triage Vitals  Enc Vitals Group     BP 03/24/19 1158 (!) 133/92     Pulse Rate 03/24/19 1158 94     Resp 03/24/19 1158 18     Temp 03/24/19 1158 98.5 F (36.9 C)     Temp src --      SpO2 03/24/19 1158 98 %     Weight --      Height --      Head Circumference --      Peak Flow --      Pain Score 03/24/19 1155 5     Pain Loc --      Pain Edu? --      Excl. in GC? --    No data found.  Updated Vital Signs BP (!) 133/92   Pulse 94   Temp 98.5 F (36.9 C)   Resp 18   LMP  (LMP Unknown)   SpO2 98%   Visual Acuity Right Eye Distance:   Left Eye Distance:   Bilateral Distance:    Right Eye Near:   Left Eye Near:    Bilateral Near:     Physical Exam Constitutional:      General: She is not in acute distress.    Appearance: She is well-developed and normal weight. She is not ill-appearing or toxic-appearing.  Cardiovascular:     Rate and Rhythm: Normal rate and regular rhythm.     Pulses: Normal pulses.     Heart sounds: Normal heart sounds. No murmur.  Pulmonary:     Effort: Pulmonary effort is normal. No respiratory distress.     Breath sounds: Normal breath sounds.  Chest:     Chest wall: No tenderness.  Abdominal:     General: Abdomen is flat. Bowel sounds are normal.     Palpations: Abdomen is soft.     Tenderness: There is no abdominal tenderness. There is no right CVA tenderness, left CVA tenderness, guarding or rebound.     Hernia: No hernia is present.  Neurological:     Mental Status: She is alert and oriented to person,  place, and time.      UC Treatments / Results  Labs (all labs ordered are listed,  but only abnormal results are displayed) Labs Reviewed  POCT URINALYSIS DIP (MANUAL ENTRY) - Abnormal; Notable for the following components:      Result Value   Blood, UA trace-intact (*)    All other components within normal limits  URINE CULTURE  POCT URINE PREGNANCY    EKG   Radiology No results found.  Procedures Procedures (including critical care time)  Medications Ordered in UC Medications - No data to display  Initial Impression / Assessment and Plan / UC Course  I have reviewed the triage vital signs and the nursing notes.  Pertinent labs & imaging results that were available during my care of the patient were reviewed by me and considered in my medical decision making (see chart for details).  UA is negative and will be sent for culture.  Urine pregnancy test was negative.  Patient stable for discharge, not in acute distress.  Advised patient to take medication as prescribed and to go to ED for worsening of symptoms.  Final Clinical Impressions(s) / UC Diagnoses   Final diagnoses:  Pain of upper abdomen  Lower abdominal pain     Discharge Instructions     UA is negative. We will send for culture.  Urine pregnancy test is negative  Get rest and drink fluids Zofran prescribed.  Take as directed.    DIET Instructions:  30 minutes after taking nausea medicine, begin with sips of clear liquids. If able to hold down 2 - 4 ounces for 30 minutes, begin drinking more. Increase your fluid intake to replace losses. Clear liquids only for 24 hours (water, tea, sport drinks, clear flat ginger ale or cola and juices, broth, jello, popsicles, ect). Advance to bland foods, applesauce, rice, baked or boiled chicken, ect. Avoid milk, greasy foods and anything that doesn't agree with you.  If you experience new or worsening symptoms return or go to ER such as fever, chills, nausea, vomiting, diarrhea, bloody or dark tarry stools, constipation, urinary symptoms, worsening  abdominal discomfort, symptoms that do not improve with medications, inability to keep fluids down, etc...  Reviewed expectations re: course of current medical issues. Questions answered. Outlined signs and symptoms indicating need for more acute intervention. Patient verbalized understanding. After Visit Summary given.     ED Prescriptions    Medication Sig Dispense Auth. Provider   ondansetron (ZOFRAN) 4 MG tablet  (Status: Discontinued) Take 1 tablet (4 mg total) by mouth every 8 (eight) hours as needed for nausea or vomiting. 4 tablet Amiya Escamilla, Zachery DakinsKomlanvi S, FNP   dicyclomine (BENTYL) 20 MG tablet Take 1 tablet (20 mg total) by mouth 2 (two) times daily. 20 tablet Jentry Mcqueary S, FNP   ondansetron (ZOFRAN) 4 MG tablet Take 1 tablet (4 mg total) by mouth every 8 (eight) hours as needed for nausea or vomiting. 8 tablet Kyllie Pettijohn, Zachery DakinsKomlanvi S, FNP     PDMP not reviewed this encounter.   Durward ParcelAvegno, Ifeoma Vallin S, FNP 03/24/19 1247    Durward ParcelAvegno, Amilio Zehnder S, FNP 03/24/19 1249

## 2019-03-24 NOTE — ED Triage Notes (Signed)
Pt presents with abdominal pain and nausea and vomiting that began on Monday, pt hasn't vomited since Monday but has remained nauseated

## 2019-03-25 LAB — URINE CULTURE: Culture: <10000 — AB

## 2019-03-26 ENCOUNTER — Other Ambulatory Visit: Payer: Self-pay

## 2019-03-26 DIAGNOSIS — R102 Pelvic and perineal pain: Secondary | ICD-10-CM | POA: Diagnosis not present

## 2019-05-26 ENCOUNTER — Other Ambulatory Visit: Payer: Self-pay | Admitting: Obstetrics and Gynecology

## 2019-05-26 DIAGNOSIS — R102 Pelvic and perineal pain: Secondary | ICD-10-CM

## 2019-06-05 ENCOUNTER — Ambulatory Visit
Admission: RE | Admit: 2019-06-05 | Discharge: 2019-06-05 | Disposition: A | Discharge status: Home / Self Care | Payer: 59 | Source: Ambulatory Visit | Attending: Obstetrics and Gynecology | Admitting: Obstetrics and Gynecology

## 2019-06-05 DIAGNOSIS — R102 Pelvic and perineal pain: Secondary | ICD-10-CM

## 2019-06-05 MED ORDER — IOPAMIDOL (ISOVUE-300) INJECTION 61%
100.0000 mL | Freq: Once | INTRAVENOUS | Status: AC | PRN
  Administered 2019-06-05: 100 mL via INTRAVENOUS

## 2019-09-03 ENCOUNTER — Other Ambulatory Visit: Payer: Self-pay

## 2019-09-03 ENCOUNTER — Ambulatory Visit
Admission: EM | Admit: 2019-09-03 | Discharge: 2019-09-03 | Disposition: A | Discharge status: Home / Self Care | Payer: 59 | Attending: Emergency Medicine | Admitting: Emergency Medicine

## 2019-09-03 DIAGNOSIS — J029 Acute pharyngitis, unspecified: Secondary | ICD-10-CM | POA: Diagnosis present

## 2019-09-03 DIAGNOSIS — Z1152 Encounter for screening for COVID-19: Secondary | ICD-10-CM | POA: Insufficient documentation

## 2019-09-03 LAB — POCT RAPID STREP A (OFFICE): Rapid Strep A Screen: NEGATIVE

## 2019-09-03 MED ORDER — LIDOCAINE VISCOUS HCL 2 % MT SOLN: 15.0000 mL | OROMUCOSAL | 1 refills | Status: DC | PRN

## 2019-09-03 MED ORDER — MENTHOL 3 MG MT LOZG: 1.0000 | LOZENGE | OROMUCOSAL | 0 refills | Status: DC | PRN

## 2019-09-03 NOTE — ED Provider Notes (Signed)
[facility] CARE CENTER   [id number] 09/03/19 Arrival Time: 1403  CL:EXNT THROAT  SUBJECTIVE: History from: patient.  Theresa Jackson is a 29 y.o. female who presents to the urgent care for complaint of sore throat for the past 1 day.  Denies sick exposure to strep, flu or mono, or precipitating event.  Has tried OTC Claritin without relief.  Symptoms are made worse with swallowing, but tolerating liquids and own secretions without difficulty.  Reports/ denies previous symptoms in the past.     Denies fever, chills, fatigue, ear pain, sinus pain, rhinorrhea, nasal congestion, cough, SOB, wheezing, chest pain, nausea, rash, changes in bowel or bladder habits.    Denies fever, chills, decreased appetite, decreased activity, drooling, vomiting, wheezing, rash, changes in bowel or bladder function.     Received flu shot this year: no.  ROS: As per HPI.  All other pertinent ROS negative.     Past Medical History:  Diagnosis Date  . ADHD (attention deficit hyperactivity disorder)   . Anxiety   . Headache(784.0)   . Hx of pre-eclampsia in prior pregnancy, currently pregnant   . Obsessive-compulsive disorder   . Seizures (HCC)    Past Surgical History:  Procedure Laterality Date  . TONSILLECTOMY    . TOOTH EXTRACTION    . Tubes in Ears     No Known Allergies Current Facility-Administered Medications on File Prior to Encounter  Medication Dose Route Frequency Provider Last Rate Last Admin  . betamethasone acetate-betamethasone sodium phosphate (CELESTONE) injection 6 mg  6 mg Intramuscular Once Mechele Claude, MD       Current Outpatient Medications on File Prior to Encounter  Medication Sig Dispense Refill  . dicyclomine (BENTYL) 20 MG tablet Take 1 tablet (20 mg total) by mouth 2 (two) times daily. 20 tablet 0  . ibuprofen (ADVIL,MOTRIN) 600 MG tablet Take 1 tablet (600 mg total) by mouth every 6 (six) hours. 30 tablet 0  . linaclotide (LINZESS) 290 MCG CAPS capsule Take  290 mcg by mouth daily before breakfast.    . Multiple Vitamin (MULTIVITAMIN) capsule Take 1 capsule by mouth daily.    . ondansetron (ZOFRAN) 4 MG tablet Take 1 tablet (4 mg total) by mouth every 8 (eight) hours as needed for nausea or vomiting. 8 tablet 0  . sertraline (ZOLOFT) 100 MG tablet Take 2 and one half tablets daily 225 tablet 3  . TRULANCE 3 MG TABS TAKE ONE TABLET BY MOUTHDONCE DAILY.     Social History   Socioeconomic History  . Marital status: Married    Spouse name: Not on file  . Number of children: Not on file  . Years of education: Not on file  . Highest education level: Not on file  Occupational History  . Not on file  Tobacco Use  . Smoking status: Never Smoker  . Smokeless tobacco: Never Used  Substance and Sexual Activity  . Alcohol use: Yes    Alcohol/week: 1.0 standard drinks    Types: 1 Cans of beer per week    Comment: i bottle of beer once per week  . Drug use: No  . Sexual activity: Yes    Partners: Male    Birth control/protection: I.U.D.  Other Topics Concern  . Not on file  Social History Narrative  . Not on file   Social Determinants of Health   Financial Resource Strain:   . Difficulty of Paying Living Expenses:   Food Insecurity:   . Worried About [employer] of  Food in the Last Year:   . Ran Out of Food in the Last Year:   Transportation Needs:   . Freight forwarder (Medical):   Marland Kitchen Lack of Transportation (Non-Medical):   Physical Activity:   . Days of Exercise per Week:   . Minutes of Exercise per Session:   Stress:   . Feeling of Stress :   Social Connections:   . Frequency of Communication with Friends and Family:   . Frequency of Social Gatherings with Friends and Family:   . Attends Religious Services:   . Active Member of Clubs or Organizations:   . Attends Banker Meetings:   Marland Kitchen Marital Status:   Intimate Partner Violence:   . Fear of Current or Ex-Partner:   . Emotionally Abused:   Marland Kitchen Physically  Abused:   . Sexually Abused:    Family History  Problem Relation Age of Onset  . Depression Maternal Grandfather   . Anxiety disorder Mother   . Hypertension Mother   . ADD / ADHD Brother   . ADD / ADHD Maternal Aunt   . Alcohol abuse Maternal Grandmother   . Bipolar disorder Maternal Grandmother   . Breast cancer Maternal Grandmother   . Heart disease Maternal Grandmother   . Dementia Paternal Grandmother   . Hip fracture Paternal Grandmother   . Breast cancer Paternal Grandmother   . Seizures Cousin   . ADD / ADHD Maternal Aunt   . Drug abuse Neg Hx   . OCD Neg Hx   . Paranoid behavior Neg Hx   . Schizophrenia Neg Hx   . Sexual abuse Neg Hx   . Physical abuse Neg Hx     OBJECTIVE:  Vitals:   09/03/19 1420  BP: 125/85  Pulse: 92  Resp: 20  Temp: 98.9 F (37.2 C)  SpO2: 99%     General appearance: alert; appears fatigued, but nontoxic, speaking in full sentences and managing own secretions HEENT: NCAT; Ears: EACs clear, TMs pearly gray with visible cone of light, without erythema; Eyes: PERRL, EOMI grossly; Nose: no obvious rhinorrhea; Throat: oropharynx clear, tonsils 1+ and mildly erythematous without white tonsillar exudates, uvula midline Neck: supple without LAD Lungs: CTA bilaterally without adventitious breath sounds; cough absent Heart: regular rate and rhythm.  Radial pulses 2+ symmetrical bilaterally Skin: warm and dry Psychological: alert and cooperative; normal mood and affect  LABS: Results for orders placed or performed during the hospital encounter of 09/03/19 (from the past 24 hour(s))  POCT rapid strep A     Status: None   Collection Time: 09/03/19  2:25 PM  Result Value Ref Range   Rapid Strep A Screen Negative Negative     ASSESSMENT & PLAN:  1. Sore throat   2. Encounter for screening for COVID-19     Meds ordered this encounter  Medications  . lidocaine (XYLOCAINE) 2 % solution    Sig: Use as directed 15 mLs in the mouth or throat  as needed for mouth pain.    Dispense:  100 mL    Refill:  1  . menthol-cetylpyridinium (CEPACOL) 3 MG lozenge    Sig: Take 1 lozenge (3 mg total) by mouth as needed for sore throat.    Dispense:  100 tablet    Refill:  0    Discharge instructions Strep test negative, will send out for culture and we will call you with results Get plenty of rest and push fluids Continue to take Zyrtec . Use  daily for symptomatic relief Continue to take Flonase as prescribed Viscous lidocaine prescribed.  This is an oral solution you can swish, and gargle as needed for symptomatic relief of sore throat.  Do not exceed 8 doses in a 24 hour period.  Do not use prior to eating, as this will numb your entire mouth.   Drink warm or cool liquids, use throat lozenges, or popsicles to help alleviate symptoms Take OTC ibuprofen or tylenol as needed for pain Follow up with PCP if symptoms persists Return or go to ER if patient has any new or worsening symptoms such as fever, chills, nausea, vomiting, worsening sore throat, cough, abdominal pain, chest pain, changes in bowel or bladder habits, etc...  Reviewed expectations re: course of current medical issues. Questions answered. Outlined signs and symptoms indicating need for more acute intervention. Patient verbalized understanding. After Visit Summary given.        Emerson Monte, [place] 09/03/19 1457

## 2019-09-03 NOTE — ED Triage Notes (Signed)
Pt presents with sore throat that began yesterday , no other symptoms

## 2019-09-03 NOTE — Discharge Instructions (Addendum)
Covid testing was completed.  Someone will call if your result is abnormal Strep test negative, will send out for culture and we will call you with results Get plenty of rest and push fluids Continue to take Zyrtec . Use daily for symptomatic relief Continue to take Flonase as prescribed Cepacol lozenges was prescribed Viscous lidocaine prescribed.  This is an oral solution you can swish, and gargle as needed for symptomatic relief of sore throat.  Do not exceed 8 doses in a 24 hour period.  Do not use prior to eating, as this will numb your entire mouth.   Drink warm or cool liquids, use throat lozenges, or popsicles to help alleviate symptoms Take OTC ibuprofen or tylenol as needed for pain Follow up with PCP if symptoms persists Return or go to ER if patient has any new or worsening symptoms such as fever, chills, nausea, vomiting, worsening sore throat, cough, abdominal pain, chest pain, changes in bowel or bladder habits, etc..Marland Kitchen

## 2019-09-05 LAB — NOVEL CORONAVIRUS, NAA: SARS-CoV-2, NAA: NOT DETECTED

## 2019-09-05 LAB — SARS-COV-2, NAA 2 DAY TAT

## 2019-09-06 LAB — CULTURE, GROUP A STREP (THRC)

## 2019-10-09 ENCOUNTER — Ambulatory Visit
Admission: RE | Admit: 2019-10-09 | Discharge: 2019-10-09 | Disposition: A | Discharge status: Home / Self Care | Payer: 59 | Source: Ambulatory Visit | Attending: Emergency Medicine | Admitting: Emergency Medicine

## 2019-10-09 ENCOUNTER — Other Ambulatory Visit: Payer: Self-pay

## 2019-10-09 VITALS — BP 115/78 | HR 84 | Temp 98.6°F | Resp 18 | Wt 118.0 lb

## 2019-10-09 DIAGNOSIS — R42 Dizziness and giddiness: Secondary | ICD-10-CM | POA: Diagnosis not present

## 2019-10-09 MED ORDER — PREDNISONE 20 MG PO TABS: 20.0000 mg | ORAL_TABLET | Freq: Two times a day (BID) | ORAL | 0 refills | Status: AC

## 2019-10-09 MED ORDER — MECLIZINE HCL 25 MG PO TABS: 25.0000 mg | ORAL_TABLET | Freq: Three times a day (TID) | ORAL | 0 refills | Status: DC | PRN

## 2019-10-09 MED ORDER — ONDANSETRON HCL 4 MG PO TABS: 4.0000 mg | ORAL_TABLET | Freq: Four times a day (QID) | ORAL | 0 refills | Status: DC

## 2019-10-09 NOTE — ED Triage Notes (Signed)
Dizziness started on Sunday.  Room feels like its spinning.  Pt also having nausea.  States feels like there is something stuck in throat.

## 2019-10-09 NOTE — ED Provider Notes (Signed)
[facility] CARE CENTER   [id number] 10/09/19 Arrival Time: 1700  [id number]  SUBJECTIVE:  Theresa Jackson is a 29 y.o. female who presents with complaint of dizziness that began 2 days ago.  Denies a precipitating event, trauma, or recent URI within the past month.  Describes the dizziness as "the room spinning."  States that it is constant.  Has tried zyrtec without relief.  Symptoms made worse with standing from a seated position.  Denies previous symptoms.  Complains of associated slurred speech, forgetfulness, and nausea.  Denies fever, chills, vomiting, hearing changes, tinnitus, ear pain, chest pain, syncope, SOB, weakness, facial drooping/ asymmetry, incoordination, numbness or tingling, abdominal pain, changes in bowel or bladder habits.    ROS: As per HPI.  All other pertinent ROS negative.    Past Medical History:  Diagnosis Date  . ADHD (attention deficit hyperactivity disorder)   . Anxiety   . Headache(784.0)   . Hx of pre-eclampsia in prior pregnancy, currently pregnant   . Obsessive-compulsive disorder   . Seizures (HCC)    Past Surgical History:  Procedure Laterality Date  . TONSILLECTOMY    . TOOTH EXTRACTION    . Tubes in Ears     No Known Allergies Current Facility-Administered Medications on File Prior to Encounter  Medication Dose Route Frequency Provider Last Rate Last Admin  . betamethasone acetate-betamethasone sodium phosphate (CELESTONE) injection 6 mg  6 mg Intramuscular Once Mechele Claude, MD       Current Outpatient Medications on File Prior to Encounter  Medication Sig Dispense Refill  . Multiple Vitamin (MULTIVITAMIN) capsule Take 1 capsule by mouth daily.    . sertraline (ZOLOFT) 100 MG tablet Take 2 and one half tablets daily 225 tablet 3  . [DISCONTINUED] dicyclomine (BENTYL) 20 MG tablet Take 1 tablet (20 mg total) by mouth 2 (two) times daily. 20 tablet 0  . [DISCONTINUED] linaclotide (LINZESS) 290 MCG CAPS capsule Take 290 mcg by  mouth daily before breakfast.     Social History   Socioeconomic History  . Marital status: Married    Spouse name: Not on file  . Number of children: Not on file  . Years of education: Not on file  . Highest education level: Not on file  Occupational History  . Not on file  Tobacco Use  . Smoking status: Never Smoker  . Smokeless tobacco: Never Used  Substance and Sexual Activity  . Alcohol use: Yes    Alcohol/week: 1.0 standard drink    Types: 1 Cans of beer per week    Comment: i bottle of beer once per week  . Drug use: No  . Sexual activity: Yes    Partners: Male    Birth control/protection: I.U.D.  Other Topics Concern  . Not on file  Social History Narrative  . Not on file   Social Determinants of Health   Financial Resource Strain:   . Difficulty of Paying Living Expenses:   Food Insecurity:   . Worried About Programme researcher, broadcasting/film/video in the Last Year:   . Barista in the Last Year:   Transportation Needs:   . Freight forwarder (Medical):   Marland Kitchen Lack of Transportation (Non-Medical):   Physical Activity:   . Days of Exercise per Week:   . Minutes of Exercise per Session:   Stress:   . Feeling of Stress :   Social Connections:   . Frequency of Communication with Friends and Family:   . Frequency of Social  Gatherings with Friends and Family:   . Attends Religious Services:   . Active Member of Clubs or Organizations:   . Attends Banker Meetings:   Marland Kitchen Marital Status:   Intimate Partner Violence:   . Fear of Current or Ex-Partner:   . Emotionally Abused:   Marland Kitchen Physically Abused:   . Sexually Abused:    Family History  Problem Relation Age of Onset  . Depression Maternal Grandfather   . Anxiety disorder Mother   . Hypertension Mother   . ADD / ADHD Brother   . ADD / ADHD Maternal Aunt   . Alcohol abuse Maternal Grandmother   . Bipolar disorder Maternal Grandmother   . Breast cancer Maternal Grandmother   . Heart disease Maternal  Grandmother   . Dementia Paternal Grandmother   . Hip fracture Paternal Grandmother   . Breast cancer Paternal Grandmother   . Seizures Cousin   . ADD / ADHD Maternal Aunt   . Drug abuse Neg Hx   . OCD Neg Hx   . Paranoid behavior Neg Hx   . Schizophrenia Neg Hx   . Sexual abuse Neg Hx   . Physical abuse Neg Hx     OBJECTIVE:  Vitals:   10/09/19 1712  BP: 115/78  Pulse: 84  Resp: 18  Temp: 98.6 F (37 C)  TempSrc: Oral  SpO2: 98%  Weight: 118 lb (53.5 kg)    General appearance: alert; no distress Eyes: PERRLA; EOMI; conjunctiva normal HENT: normocephalic; atraumatic; TMs normal; nasal mucosa normal; oral mucosa normal Neck: supple with FROM Lungs: clear to auscultation bilaterally Heart: regular rate and rhythm Extremities: no cyanosis or edema; symmetrical with no gross deformities Skin: warm and dry Neurologic: normal gait; CN 2-12 grossly intact; strength and sensation intact about the upper and lower extremities; negative pronator drift; finger to nose without difficulty; no obvious facial droop or slurred speech Psychological: alert and cooperative; anxious mood and affect; tearful upon discharge  ASSESSMENT & PLAN:  1. Dizziness and giddiness     Meds ordered this encounter  Medications  . predniSONE (DELTASONE) 20 MG tablet    Sig: Take 1 tablet (20 mg total) by mouth 2 (two) times daily with a meal for 5 days.    Dispense:  10 tablet    Refill:  0    Order Specific Question:   Supervising Provider    Answer:   Eustace Moore [[id number]]  . meclizine (ANTIVERT) 25 MG tablet    Sig: Take 1 tablet (25 mg total) by mouth 3 (three) times daily as needed for dizziness.    Dispense:  30 tablet    Refill:  0    Order Specific Question:   Supervising Provider    Answer:   Eustace Moore [[id number]]  . ondansetron (ZOFRAN) 4 MG tablet    Sig: Take 1 tablet (4 mg total) by mouth every 6 (six) hours.    Dispense:  12 tablet    Refill:  0    Order Specific  Question:   Supervising Provider    Answer:   Eustace Moore [[id number]]   Unable to rule out stroke, TIA, or brain mass in urgent care setting.  Offered patient further evaluation and management in the ED.  Patient declines at this time and would like to try outpatient therapy first.  Aware of the risk associated with this decision including missed diagnosis, organ damage, organ failure, and/or death.  Patient aware and in agreement.  Prednisone prescribed.  Use as directed and to completion Meclizine prescribed.  Take as directed for symptomatic relief.  This medication may make you drowsy so use with cautions while driving or operating heavy machinery. Zofran prescribed.  Take as needed for nausea Follow up with PCP if symptoms persists Return or go to the ER if you have any new or worsening symptoms fever, chills, nausea, vomiting, facial droop, slurred speech, weakness in arms or legs, chest pain, abdominal pain, etc...   Reviewed expectations re: course of current medical issues. Questions answered. Outlined signs and symptoms indicating need for more acute intervention. Patient verbalized understanding. After Visit Summary given.    Rennis Harding, PA-C 10/09/19 1811

## 2019-10-09 NOTE — Discharge Instructions (Signed)
Unable to rule out stroke, TIA, or brain mass in urgent care setting.  Offered patient further evaluation and management in the ED.  Patient declines at this time and would like to try outpatient therapy first.  Aware of the risk associated with this decision including missed diagnosis, organ damage, organ failure, and/or death.  Patient aware and in agreement.     Prednisone prescribed.  Use as directed and to completion Meclizine prescribed.  Take as directed for symptomatic relief.  This medication may make you drowsy so use with cautions while driving or operating heavy machinery. Zofran prescribed.  Take as needed for nausea Follow up with PCP if symptoms persists Return or go to the ER if you have any new or worsening symptoms fever, chills, nausea, vomiting, facial droop, slurred speech, weakness in arms or legs, chest pain, abdominal pain, etc..Marland Kitchen

## 2019-10-10 ENCOUNTER — Ambulatory Visit: Payer: Self-pay | Admitting: *Deleted

## 2019-10-10 NOTE — Telephone Encounter (Signed)
Patient is calling the community line with c/o dizziness. Patient states her symptoms seen to be getting worse. Patient was seen at [facility] yesterday- and was treated with prednisone and meclizine. Patient states the medication is not helping. Advised patient to call PCP( she does not like her PCP), go back to UC or go to ED. Patient had been putting off ED because of expense. Advised patient ED toady- she is not improving.  Reason for Disposition . Patient sounds very sick or weak to the triager  Answer Assessment - Initial Assessment Questions 1. DESCRIPTION: "Describe your dizziness."     Extremely dizzy- "out of it" 2. LIGHTHEADED: "Do you feel lightheaded?" (e.g., somewhat faint, woozy, weak upon standing)     Yes- weak, woozy-altered state 3. VERTIGO: "Do you feel like either you or the room is spinning or tilting?" (i.e. vertigo)     yes 4. SEVERITY: "How bad is it?"  "Do you feel like you are going to faint?" "Can you stand and walk?"   - MILD: Feels slightly dizzy, but walking normally.   - MODERATE: Feels very unsteady when walking, but not falling; interferes with normal activities (e.g., school, work) .   - SEVERE: Unable to walk without falling, or requires assistance to walk without falling; feels like passing out now.      moderate 5. ONSET:  "When did the dizziness begin?"     Since Sunday 6. AGGRAVATING FACTORS: "Does anything make it worse?" (e.g., standing, change in head position)     Change I head position 7. HEART RATE: "Can you tell me your heart rate?" "How many beats in 15 seconds?"  (Note: not all patients can do this)       90 -100 8. CAUSE: "What do you think is causing the dizziness?"     No- not sure 9. RECURRENT SYMPTOM: "Have you had dizziness before?" If Yes, ask: "When was the last time?" "What happened that time?"     no 10. OTHER SYMPTOMS: "Do you have any other symptoms?" (e.g., fever, chest pain, vomiting, diarrhea, bleeding)       No- nausea 11.  PREGNANCY: "Is there any chance you are pregnant?" "When was your last menstrual period?"       No- LMP-IUD removed last week  Protocols used: DIZZINESS [facility]

## 2019-10-18 ENCOUNTER — Ambulatory Visit: Payer: 59 | Admitting: Allergy

## 2019-10-19 ENCOUNTER — Ambulatory Visit: Payer: Self-pay | Admitting: *Deleted

## 2019-10-19 NOTE — Telephone Encounter (Signed)
Patient has been dizzy for 2 weeks and head feels like it is vibrating constantly. Patient states she has been seen for this and studies have all come back normal/negative. Patient states she has taken the prescribed medication- but her symptoms are not better. Patient is frustrated because she feels no one is believing her. Patient advised she does need to follow up if she is still having symptoms- she needs to discuss next steps with her PCP. Patient advised if she does not see PCP- then UC would be viable alternative.  Reason for Disposition . [1] Numbness or tingling on both sides of body AND [2] is a new symptom present > 24 hours    Patient states she also has tingling in hands and arms at times. But the vibrating sensation is all of the time.  Answer Assessment - Initial Assessment Questions 1. SYMPTOM: "What is the main symptom you are concerned about?" (e.g., weakness, numbness)     Head feels like it is vibrating- "like electric toothbrush is against it" 2. ONSET: "When did this start?" (minutes, hours, days; while sleeping)     4 days 3. LAST NORMAL: "When was the last time you were normal (no symptoms)?"     Patient been feeling dizzy for 4 weeks 4. PATTERN "Does this come and go, or has it been constant since it started?"  "Is it present now?"     Constant since started- front of head and top more than anywhere else 5. CARDIAC SYMPTOMS: "Have you had any of the following symptoms: chest pain, difficulty breathing, palpitations?"     no 6. NEUROLOGIC SYMPTOMS: "Have you had any of the following symptoms: headache, dizziness, vision loss, double vision, changes in speech, unsteady on your feet?"     Dizziness, headache, unsteady 7. OTHER SYMPTOMS: "Do you have any other symptoms?"     Sometimes tingly in arms and face 8. PREGNANCY: "Is there any chance you are pregnant?" "When was your last menstrual period?"     No- LMP- IUD removed 2 weeks ago  Protocols used: NEUROLOGIC  DEFICIT-A-AH

## 2019-11-13 ENCOUNTER — Ambulatory Visit: Payer: 59 | Admitting: Allergy

## 2019-12-12 ENCOUNTER — Ambulatory Visit: Payer: 59 | Admitting: Allergy & Immunology

## 2020-02-21 ENCOUNTER — Ambulatory Visit
Admission: RE | Admit: 2020-02-21 | Discharge: 2020-02-21 | Disposition: A | Discharge status: Home / Self Care | Payer: 59 | Source: Ambulatory Visit | Attending: Emergency Medicine | Admitting: Emergency Medicine

## 2020-02-21 ENCOUNTER — Other Ambulatory Visit: Payer: Self-pay

## 2020-02-21 VITALS — BP 115/67 | HR 92 | Temp 97.8°F | Resp 16

## 2020-02-21 DIAGNOSIS — H9203 Otalgia, bilateral: Secondary | ICD-10-CM

## 2020-02-21 DIAGNOSIS — H66001 Acute suppurative otitis media without spontaneous rupture of ear drum, right ear: Secondary | ICD-10-CM

## 2020-02-21 MED ORDER — AMOXICILLIN 500 MG PO CAPS: 500.0000 mg | ORAL_CAPSULE | Freq: Two times a day (BID) | ORAL | 0 refills | Status: AC

## 2020-02-21 NOTE — ED Provider Notes (Signed)
[facility]   [id number] 02/21/20 Arrival Time: 1312  CC:EAR PAIN  SUBJECTIVE: History from: patient.  Theresa Jackson is a 29 y.o. female who presents with of bilateral ear itching x few dys.  Family at home with similar symptoms.  Currently being treated for ear infections.  Patient states the pain is constant and itchy in character.  Denies alleviating or aggravating factors.  Reports similar symptoms in the past with ear infection.    Denies fever, chills, fatigue, sinus pain, rhinorrhea, ear discharge, sore throat, SOB, wheezing, chest pain, nausea, changes in bowel or bladder habits.    Had negative covid test earlier this week  ROS: As per HPI.  All other pertinent ROS negative.     Past Medical History:  Diagnosis Date  . ADHD (attention deficit hyperactivity disorder)   . Anxiety   . Headache(784.0)   . Hx of pre-eclampsia in prior pregnancy, currently pregnant   . Obsessive-compulsive disorder   . Seizures (HCC)    Past Surgical History:  Procedure Laterality Date  . TONSILLECTOMY    . TOOTH EXTRACTION    . Tubes in Ears     No Known Allergies Current Facility-Administered Medications on File Prior to Encounter  Medication Dose Route Frequency Provider Last Rate Last Admin  . betamethasone acetate-betamethasone sodium phosphate (CELESTONE) injection 6 mg  6 mg Intramuscular Once Mechele Claude, MD       Current Outpatient Medications on File Prior to Encounter  Medication Sig Dispense Refill  . meclizine (ANTIVERT) 25 MG tablet Take 1 tablet (25 mg total) by mouth 3 (three) times daily as needed for dizziness. 30 tablet 0  . Multiple Vitamin (MULTIVITAMIN) capsule Take 1 capsule by mouth daily.    . ondansetron (ZOFRAN) 4 MG tablet Take 1 tablet (4 mg total) by mouth every 6 (six) hours. 12 tablet 0  . sertraline (ZOLOFT) 100 MG tablet Take 2 and one half tablets daily 225 tablet 3  . [DISCONTINUED] dicyclomine (BENTYL) 20 MG tablet Take 1  tablet (20 mg total) by mouth 2 (two) times daily. 20 tablet 0  . [DISCONTINUED] linaclotide (LINZESS) 290 MCG CAPS capsule Take 290 mcg by mouth daily before breakfast.     Social History   Socioeconomic History  . Marital status: Married    Spouse name: Not on file  . Number of children: Not on file  . Years of education: Not on file  . Highest education level: Not on file  Occupational History  . Not on file  Tobacco Use  . Smoking status: Never Smoker  . Smokeless tobacco: Never Used  Substance and Sexual Activity  . Alcohol use: Yes    Alcohol/week: 1.0 standard drink    Types: 1 Cans of beer per week    Comment: i bottle of beer once per week  . Drug use: No  . Sexual activity: Yes    Partners: Male    Birth control/protection: I.U.D.  Other Topics Concern  . Not on file  Social History Narrative  . Not on file   Social Determinants of Health   Financial Resource Strain:   . Difficulty of Paying Living Expenses: Not on file  Food Insecurity:   . Worried About Programme researcher, broadcasting/film/video in the Last Year: Not on file  . Ran Out of Food in the Last Year: Not on file  Transportation Needs:   . Lack of Transportation (Medical): Not on file  . Lack of Transportation (Non-Medical): Not on  file  Physical Activity:   . Days of Exercise per Week: Not on file  . Minutes of Exercise per Session: Not on file  Stress:   . Feeling of Stress : Not on file  Social Connections:   . Frequency of Communication with Friends and Family: Not on file  . Frequency of Social Gatherings with Friends and Family: Not on file  . Attends Religious Services: Not on file  . Active Member of Clubs or Organizations: Not on file  . Attends Banker Meetings: Not on file  . Marital Status: Not on file  Intimate Partner Violence:   . Fear of Current or Ex-Partner: Not on file  . Emotionally Abused: Not on file  . Physically Abused: Not on file  . Sexually Abused: Not on file   Family  History  Problem Relation Age of Onset  . Depression Maternal Grandfather   . Anxiety disorder Mother   . Hypertension Mother   . ADD / ADHD Brother   . ADD / ADHD Maternal Aunt   . Alcohol abuse Maternal Grandmother   . Bipolar disorder Maternal Grandmother   . Breast cancer Maternal Grandmother   . Heart disease Maternal Grandmother   . Dementia Paternal Grandmother   . Hip fracture Paternal Grandmother   . Breast cancer Paternal Grandmother   . Seizures Cousin   . ADD / ADHD Maternal Aunt   . Drug abuse Neg Hx   . OCD Neg Hx   . Paranoid behavior Neg Hx   . Schizophrenia Neg Hx   . Sexual abuse Neg Hx   . Physical abuse Neg Hx     OBJECTIVE:  Vitals:   02/21/20 1344  BP: 115/67  Pulse: 92  Resp: 16  Temp: 97.8 F (36.6 C)  TempSrc: Oral  SpO2: 98%     General appearance: alert; mildly fatigued appearing, nontoxic; speaking in full sentences and tolerating own secretions HEENT: NCAT; Ears: EACs clear, RT TM mildly erythematous, LT TM pearly gray; Eyes: PERRL.  EOM grossly intact.Nose: nares patent without rhinorrhea, Throat: oropharynx clear, tonsils non erythematous or enlarged, uvula midline  Neck: supple without LAD Lungs: unlabored respirations, symmetrical air entry; cough: absent; no respiratory distress; CTAB Heart: regular rate and rhythm.  Skin: warm and dry Psychological: alert and cooperative; normal mood and affect   ASSESSMENT & PLAN:  1. Otalgia of both ears   2. Non-recurrent acute suppurative otitis media of right ear without spontaneous rupture of tympanic membrane     Meds ordered this encounter  Medications  . amoxicillin (AMOXIL) 500 MG capsule    Sig: Take 1 capsule (500 mg total) by mouth 2 (two) times daily for 10 days.    Dispense:  20 capsule    Refill:  0    Order Specific Question:   Supervising Provider    Answer:   Eustace Moore [[id number]]    Rest and drink plenty of fluids Amoxicillin for ear infection Take medications  as directed and to completion Continue to use OTC ibuprofen and/ or tylenol as needed for pain control Follow up with PCP if symptoms persists Return here or go to the ER if you have any new or worsening symptoms fever, chills, nausea, vomiting, redness, swelling, etc...  Reviewed expectations re: course of current medical issues. Questions answered. Outlined signs and symptoms indicating need for more acute intervention. Patient verbalized understanding. After Visit Summary given.         Rennis Harding, PA-C 02/21/20  1409  

## 2020-02-21 NOTE — ED Triage Notes (Signed)
Pt's said kids and husband all have sinus and ear infections at home and she has been having itchy throat and itchy ears. Pt said no fevers, no chills

## 2020-02-21 NOTE — Discharge Instructions (Signed)
Rest and drink plenty of fluids Amoxicillin for ear infection Take medications as directed and to completion Continue to use OTC ibuprofen and/ or tylenol as needed for pain control Follow up with PCP if symptoms persists Return here or go to the ER if you have any new or worsening symptoms fever, chills, nausea, vomiting, redness, swelling, etc..Marland Kitchen

## 2020-09-03 ENCOUNTER — Other Ambulatory Visit (HOSPITAL_COMMUNITY): Payer: Self-pay | Admitting: Physician Assistant

## 2020-09-03 DIAGNOSIS — G35 Multiple sclerosis: Secondary | ICD-10-CM

## 2020-09-04 ENCOUNTER — Other Ambulatory Visit: Payer: Self-pay

## 2020-09-04 ENCOUNTER — Ambulatory Visit (HOSPITAL_COMMUNITY)
Admission: RE | Admit: 2020-09-04 | Discharge: 2020-09-04 | Disposition: A | Discharge status: Home / Self Care | Payer: 59 | Source: Ambulatory Visit | Attending: Physician Assistant | Admitting: Physician Assistant

## 2020-09-04 DIAGNOSIS — G35 Multiple sclerosis: Secondary | ICD-10-CM | POA: Diagnosis not present

## 2020-09-04 MED ORDER — GADOBUTROL 1 MMOL/ML IV SOLN
5.0000 mL | Freq: Once | INTRAVENOUS | Status: AC | PRN
  Administered 2020-09-04: 5 mL via INTRAVENOUS

## 2020-09-22 ENCOUNTER — Other Ambulatory Visit: Payer: Self-pay

## 2020-09-22 ENCOUNTER — Ambulatory Visit
Admission: EM | Admit: 2020-09-22 | Discharge: 2020-09-22 | Disposition: A | Discharge status: Home / Self Care | Payer: 59

## 2020-09-22 ENCOUNTER — Encounter: Payer: Self-pay | Admitting: Emergency Medicine

## 2020-09-22 DIAGNOSIS — R0789 Other chest pain: Secondary | ICD-10-CM

## 2020-09-22 DIAGNOSIS — U071 COVID-19: Secondary | ICD-10-CM | POA: Diagnosis not present

## 2020-09-22 DIAGNOSIS — K2100 Gastro-esophageal reflux disease with esophagitis, without bleeding: Secondary | ICD-10-CM | POA: Diagnosis not present

## 2020-09-22 HISTORY — DX: Celiac disease: K90.0

## 2020-09-22 NOTE — ED Provider Notes (Signed)
[facility]    CSN: [id number] Arrival date & time: 09/22/20  1629      History   Chief Complaint Chief Complaint  Patient presents with   Covid Positive    HPI Theresa Jackson is a 30 y.o. female.   HPI Patient presents today with symptoms of chest heaviness and worsening of acid reflux related to diagnosis of COVID-19 x1 day.  Patient reports that she tested positive yesterday grew concerned as she had chest heaviness.  She had a telemetry medicine visit today and felt she needed to be evaluated in person.  She has been checking oxygen and temperature at home and all vital signs have been stable.  On arrival here she is afebrile and oxygen level is normal.  Past Medical History:  Diagnosis Date   ADHD (attention deficit hyperactivity disorder)    Anxiety    Celiac disease    Headache(784.0)    Hx of pre-eclampsia in prior pregnancy, currently pregnant    Obsessive-compulsive disorder    Seizures (HCC)     Patient Active Problem List   Diagnosis Date Noted   Normal pregnancy 12/24/2016   SVD (spontaneous vaginal delivery) 12/24/2016   Indication for care in labor or delivery 12/16/2013   Otitis media 09/11/2012   Sore throat 09/11/2012   ADHD (attention deficit hyperactivity disorder) 06/16/2012   OCD (obsessive compulsive disorder) 06/16/2012   Generalized anxiety disorder 06/16/2012   Family history of benign essential tremor 06/16/2012    Past Surgical History:  Procedure Laterality Date   TONSILLECTOMY     TOOTH EXTRACTION     Tubes in Ears      OB History     Gravida  2   Para  2   Term  2   Preterm      AB      Living  2      SAB      IAB      Ectopic      Multiple  0   Live Births  2            Home Medications    Prior to Admission medications   Medication Sig Start Date End Date Taking? Authorizing Provider  meclizine (ANTIVERT) 25 MG tablet Take 1 tablet (25 mg total) by mouth 3 (three) times daily  as needed for dizziness. 10/09/19   Wurst, [place], PA-C  Multiple Vitamin (MULTIVITAMIN) capsule Take 1 capsule by mouth daily.    [provider]  ondansetron (ZOFRAN) 4 MG tablet Take 1 tablet (4 mg total) by mouth every 6 (six) hours. 10/09/19   Wurst, [place], PA-C  sertraline (ZOLOFT) 100 MG tablet Take 2 and one half tablets daily 04/12/17   Myrlene Broker, MD  dicyclomine (BENTYL) 20 MG tablet Take 1 tablet (20 mg total) by mouth 2 (two) times daily. 03/24/19 10/09/19  Durward Parcel, FNP  linaclotide (LINZESS) 290 MCG CAPS capsule Take 290 mcg by mouth daily before breakfast.  10/09/19  [provider]    Family History Family History  Problem Relation Age of Onset   Depression Maternal Grandfather    Anxiety disorder Mother    Hypertension Mother    ADD / ADHD Brother    ADD / ADHD Maternal Aunt    Alcohol abuse Maternal Grandmother    Bipolar disorder Maternal Grandmother    Breast cancer Maternal Grandmother    Heart disease Maternal Grandmother    Dementia Paternal Grandmother  Hip fracture Paternal Grandmother    Breast cancer Paternal Grandmother    Seizures Cousin    ADD / ADHD Maternal Aunt    Drug abuse Neg Hx    OCD Neg Hx    Paranoid behavior Neg Hx    Schizophrenia Neg Hx    Sexual abuse Neg Hx    Physical abuse Neg Hx     Social History Social History   Tobacco Use   Smoking status: Never   Smokeless tobacco: Never  Substance Use Topics   Alcohol use: Yes    Alcohol/week: 1.0 standard drink    Types: 1 Cans of beer per week    Comment: i bottle of beer once per week   Drug use: No     Allergies   Gluten meal   Review of Systems Review of Systems Pertinent negatives listed in HPI   Physical Exam Triage Vital Signs ED Triage Vitals  Enc Vitals Group     BP 09/22/20 1757 118/85     Pulse Rate 09/22/20 1757 79     Resp 09/22/20 1757 17     Temp 09/22/20 1757 98.7 F (37.1 C)     Temp Source 09/22/20 1757 Oral      SpO2 09/22/20 1757 99 %     Weight --      Height --      Head Circumference --      Peak Flow --      Pain Score 09/22/20 1756 0     Pain Loc --      Pain Edu? --      Excl. in GC? --    No data found.  Updated Vital Signs BP 118/85 (BP Location: Right Arm)   Pulse 79   Temp 98.7 F (37.1 C) (Oral)   Resp 17   SpO2 99%   Visual Acuity Right Eye Distance:   Left Eye Distance:   Bilateral Distance:    Right Eye Near:   Left Eye Near:    Bilateral Near:     Physical Exam   General Appearance:    Alert, cooperative, no distress  HENT: Normocephalic, ears normal, nares mucosal edema with congestion, rhinorrhea, oropharynx patent   Eyes:    PERRL, conjunctiva/corneas clear, EOM's intact       Lungs:     Clear to auscultation bilaterally, respirations unlabored  Heart:    Regular rate and rhythm  Neurologic:   Awake, alert, oriented x 3. No apparent focal neurological deficit      UC Treatments / Results  Labs (all labs ordered are listed, but only abnormal results are displayed) Labs Reviewed - No data to display  EKG   Radiology No results found.  Procedures Procedures (including critical care time)  Medications Ordered in UC Medications - No data to display  Initial Impression / Assessment and Plan / UC Course  I have reviewed the triage vital signs and the nursing notes.  Pertinent labs & imaging results that were available during my care of the patient were reviewed by me and considered in my medical decision making (see chart for details).     Patient presents today tested positive for COVID x1 day with symptoms present for the last 5 days.  Lung exam unremarkable.  Vital signs are also normal.  Patient is having exacerbation of GERD symptoms.  Recommended holding off on use of PPI and trialing famotidine.  Reassurance provided along with recommendations for continued home monitoring of symptoms.  ER precautions given.  Patient verbalized  understanding Final Clinical Impressions(s) / UC Diagnoses   Final diagnoses:  COVID-19 virus infection  Chest heaviness  Gastroesophageal reflux disease with esophagitis without hemorrhage     Discharge Instructions      For your acid reflux symptoms start famotidine 20 mg twice daily until the chest burning or heaviness resolves. Hydrate well with plenty of fluids.  Your lungs were clear today.  Your vital signs are stable.  Continue to monitor oxygen and temperature at home. The average COVID 19 infection remains symptomatic from anywhere from 7 to 10 days and most people however symptoms can last longer in some individuals.    ED Prescriptions   None    PDMP not reviewed this encounter.   Bing Neighbors, FNP 09/24/20 1233

## 2020-09-22 NOTE — ED Triage Notes (Signed)
Pt tested positive for covid and is having chest tightness since then.

## 2020-09-22 NOTE — Discharge Instructions (Addendum)
For your acid reflux symptoms start famotidine 20 mg twice daily until the chest burning or heaviness resolves. Hydrate well with plenty of fluids.  Your lungs were clear today.  Your vital signs are stable.  Continue to monitor oxygen and temperature at home. The average COVID 19 infection remains symptomatic from anywhere from 7 to 10 days and most people however symptoms can last longer in some individuals.

## 2021-05-06 DIAGNOSIS — R69 Illness, unspecified: Secondary | ICD-10-CM | POA: Diagnosis not present

## 2021-05-06 DIAGNOSIS — F422 Mixed obsessional thoughts and acts: Secondary | ICD-10-CM | POA: Diagnosis not present

## 2021-05-06 DIAGNOSIS — Z79899 Other long term (current) drug therapy: Secondary | ICD-10-CM | POA: Diagnosis not present

## 2021-05-06 DIAGNOSIS — R4681 Obsessive-compulsive behavior: Secondary | ICD-10-CM | POA: Diagnosis not present

## 2021-05-06 DIAGNOSIS — Z630 Problems in relationship with spouse or partner: Secondary | ICD-10-CM | POA: Diagnosis not present

## 2021-05-06 DIAGNOSIS — F909 Attention-deficit hyperactivity disorder, unspecified type: Secondary | ICD-10-CM | POA: Diagnosis not present

## 2021-05-12 DIAGNOSIS — R69 Illness, unspecified: Secondary | ICD-10-CM | POA: Diagnosis not present

## 2021-05-19 DIAGNOSIS — R69 Illness, unspecified: Secondary | ICD-10-CM | POA: Diagnosis not present

## 2021-05-25 DIAGNOSIS — R69 Illness, unspecified: Secondary | ICD-10-CM | POA: Diagnosis not present

## 2021-05-27 DIAGNOSIS — F909 Attention-deficit hyperactivity disorder, unspecified type: Secondary | ICD-10-CM | POA: Diagnosis not present

## 2021-05-27 DIAGNOSIS — R69 Illness, unspecified: Secondary | ICD-10-CM | POA: Diagnosis not present

## 2021-05-27 DIAGNOSIS — Z79899 Other long term (current) drug therapy: Secondary | ICD-10-CM | POA: Diagnosis not present

## 2021-05-27 DIAGNOSIS — R4681 Obsessive-compulsive behavior: Secondary | ICD-10-CM | POA: Diagnosis not present

## 2021-05-27 DIAGNOSIS — F422 Mixed obsessional thoughts and acts: Secondary | ICD-10-CM | POA: Diagnosis not present

## 2021-05-27 DIAGNOSIS — Z630 Problems in relationship with spouse or partner: Secondary | ICD-10-CM | POA: Diagnosis not present

## 2021-05-27 DIAGNOSIS — F411 Generalized anxiety disorder: Secondary | ICD-10-CM | POA: Diagnosis not present

## 2021-06-01 DIAGNOSIS — R69 Illness, unspecified: Secondary | ICD-10-CM | POA: Diagnosis not present

## 2021-06-08 DIAGNOSIS — R69 Illness, unspecified: Secondary | ICD-10-CM | POA: Diagnosis not present

## 2021-06-10 DIAGNOSIS — R4681 Obsessive-compulsive behavior: Secondary | ICD-10-CM | POA: Diagnosis not present

## 2021-06-10 DIAGNOSIS — F422 Mixed obsessional thoughts and acts: Secondary | ICD-10-CM | POA: Diagnosis not present

## 2021-06-10 DIAGNOSIS — R69 Illness, unspecified: Secondary | ICD-10-CM | POA: Diagnosis not present

## 2021-06-10 DIAGNOSIS — F411 Generalized anxiety disorder: Secondary | ICD-10-CM | POA: Diagnosis not present

## 2021-06-10 DIAGNOSIS — Z79899 Other long term (current) drug therapy: Secondary | ICD-10-CM | POA: Diagnosis not present

## 2021-06-10 DIAGNOSIS — R41840 Attention and concentration deficit: Secondary | ICD-10-CM | POA: Diagnosis not present

## 2021-06-10 DIAGNOSIS — Z630 Problems in relationship with spouse or partner: Secondary | ICD-10-CM | POA: Diagnosis not present

## 2021-06-15 DIAGNOSIS — R69 Illness, unspecified: Secondary | ICD-10-CM | POA: Diagnosis not present

## 2021-06-17 DIAGNOSIS — F411 Generalized anxiety disorder: Secondary | ICD-10-CM | POA: Diagnosis not present

## 2021-06-17 DIAGNOSIS — R4681 Obsessive-compulsive behavior: Secondary | ICD-10-CM | POA: Diagnosis not present

## 2021-06-17 DIAGNOSIS — F422 Mixed obsessional thoughts and acts: Secondary | ICD-10-CM | POA: Diagnosis not present

## 2021-06-17 DIAGNOSIS — R69 Illness, unspecified: Secondary | ICD-10-CM | POA: Diagnosis not present

## 2021-06-17 DIAGNOSIS — Z630 Problems in relationship with spouse or partner: Secondary | ICD-10-CM | POA: Diagnosis not present

## 2021-06-17 DIAGNOSIS — Z79899 Other long term (current) drug therapy: Secondary | ICD-10-CM | POA: Diagnosis not present

## 2021-06-17 DIAGNOSIS — R41840 Attention and concentration deficit: Secondary | ICD-10-CM | POA: Diagnosis not present

## 2021-06-19 DIAGNOSIS — R432 Parageusia: Secondary | ICD-10-CM | POA: Diagnosis not present

## 2021-06-19 DIAGNOSIS — T280XXA Burn of mouth and pharynx, initial encounter: Secondary | ICD-10-CM | POA: Diagnosis not present

## 2021-06-19 DIAGNOSIS — X101XXA Contact with hot food, initial encounter: Secondary | ICD-10-CM | POA: Diagnosis not present

## 2021-06-22 DIAGNOSIS — R69 Illness, unspecified: Secondary | ICD-10-CM | POA: Diagnosis not present

## 2021-06-25 DIAGNOSIS — R41840 Attention and concentration deficit: Secondary | ICD-10-CM | POA: Diagnosis not present

## 2021-06-25 DIAGNOSIS — F411 Generalized anxiety disorder: Secondary | ICD-10-CM | POA: Diagnosis not present

## 2021-06-25 DIAGNOSIS — R4681 Obsessive-compulsive behavior: Secondary | ICD-10-CM | POA: Diagnosis not present

## 2021-06-25 DIAGNOSIS — Z630 Problems in relationship with spouse or partner: Secondary | ICD-10-CM | POA: Diagnosis not present

## 2021-06-25 DIAGNOSIS — R69 Illness, unspecified: Secondary | ICD-10-CM | POA: Diagnosis not present

## 2021-06-25 DIAGNOSIS — F422 Mixed obsessional thoughts and acts: Secondary | ICD-10-CM | POA: Diagnosis not present

## 2021-06-25 DIAGNOSIS — Z79899 Other long term (current) drug therapy: Secondary | ICD-10-CM | POA: Diagnosis not present

## 2021-06-26 DIAGNOSIS — R69 Illness, unspecified: Secondary | ICD-10-CM | POA: Diagnosis not present

## 2021-06-29 DIAGNOSIS — R69 Illness, unspecified: Secondary | ICD-10-CM | POA: Diagnosis not present

## 2021-07-02 DIAGNOSIS — R69 Illness, unspecified: Secondary | ICD-10-CM | POA: Diagnosis not present

## 2021-07-06 DIAGNOSIS — R69 Illness, unspecified: Secondary | ICD-10-CM | POA: Diagnosis not present

## 2021-07-09 DIAGNOSIS — R69 Illness, unspecified: Secondary | ICD-10-CM | POA: Diagnosis not present

## 2021-07-16 DIAGNOSIS — R69 Illness, unspecified: Secondary | ICD-10-CM | POA: Diagnosis not present

## 2021-07-20 DIAGNOSIS — R69 Illness, unspecified: Secondary | ICD-10-CM | POA: Diagnosis not present

## 2021-07-20 DIAGNOSIS — Z79899 Other long term (current) drug therapy: Secondary | ICD-10-CM | POA: Diagnosis not present

## 2021-07-20 DIAGNOSIS — F422 Mixed obsessional thoughts and acts: Secondary | ICD-10-CM | POA: Diagnosis not present

## 2021-07-20 DIAGNOSIS — Z630 Problems in relationship with spouse or partner: Secondary | ICD-10-CM | POA: Diagnosis not present

## 2021-07-20 DIAGNOSIS — F411 Generalized anxiety disorder: Secondary | ICD-10-CM | POA: Diagnosis not present

## 2021-07-20 DIAGNOSIS — R4681 Obsessive-compulsive behavior: Secondary | ICD-10-CM | POA: Diagnosis not present

## 2021-07-27 DIAGNOSIS — R69 Illness, unspecified: Secondary | ICD-10-CM | POA: Diagnosis not present

## 2021-08-10 DIAGNOSIS — R69 Illness, unspecified: Secondary | ICD-10-CM | POA: Diagnosis not present

## 2021-08-24 DIAGNOSIS — R69 Illness, unspecified: Secondary | ICD-10-CM | POA: Diagnosis not present

## 2021-08-27 DIAGNOSIS — Z3169 Encounter for other general counseling and advice on procreation: Secondary | ICD-10-CM | POA: Diagnosis not present

## 2021-09-07 DIAGNOSIS — R69 Illness, unspecified: Secondary | ICD-10-CM | POA: Diagnosis not present

## 2021-09-16 ENCOUNTER — Other Ambulatory Visit: Payer: Self-pay

## 2021-09-16 ENCOUNTER — Ambulatory Visit
Admission: RE | Admit: 2021-09-16 | Discharge: 2021-09-16 | Disposition: A | Discharge status: Home / Self Care | Payer: 59 | Source: Ambulatory Visit | Attending: Family Medicine | Admitting: Family Medicine

## 2021-09-16 VITALS — BP 119/80 | HR 86 | Temp 98.3°F | Resp 18

## 2021-09-16 DIAGNOSIS — J029 Acute pharyngitis, unspecified: Secondary | ICD-10-CM | POA: Diagnosis not present

## 2021-09-16 NOTE — Discharge Instructions (Signed)
Start taking Zyrtec, Flonase twice daily, Sudafed as needed.  You may also use throat lozenges, Chloraseptic spray, salt water gargles

## 2021-09-16 NOTE — ED Triage Notes (Signed)
Pt reports sore throat since yesterday morning and intermittent raspy voice. Denies fever or other symptoms.

## 2021-09-16 NOTE — ED Provider Notes (Signed)
[facility]    CSN: [id number] Arrival date & time: 09/16/21  1259      History   Chief Complaint Chief Complaint  Patient presents with   Sore Throat    Entered by patient    HPI Theresa Jackson is a 31 y.o. female.   Patient presenting today with sore throat and raspy voice that started yesterday.  Denies fever, chills, congestion, cough, chest pain, shortness of breath, abdominal pain, nausea vomiting or diarrhea.  No known sick contacts.  History of seasonal allergies not currently on anything for this.  Not trying anything over-the-counter for symptoms.    Past Medical History:  Diagnosis Date   ADHD (attention deficit hyperactivity disorder)    Anxiety    Celiac disease    Headache(784.0)    Hx of pre-eclampsia in prior pregnancy, currently pregnant    Obsessive-compulsive disorder    Seizures (HCC)     Patient Active Problem List   Diagnosis Date Noted   Normal pregnancy 12/24/2016   SVD (spontaneous vaginal delivery) 12/24/2016   Indication for care in labor or delivery 12/16/2013   Otitis media 09/11/2012   Sore throat 09/11/2012   ADHD (attention deficit hyperactivity disorder) 06/16/2012   OCD (obsessive compulsive disorder) 06/16/2012   Generalized anxiety disorder 06/16/2012   Family history of benign essential tremor 06/16/2012    Past Surgical History:  Procedure Laterality Date   TONSILLECTOMY     TOOTH EXTRACTION     Tubes in Ears      OB History     Gravida  2   Para  2   Term  2   Preterm      AB      Living  2      SAB      IAB      Ectopic      Multiple  0   Live Births  2            Home Medications    Prior to Admission medications   Medication Sig Start Date End Date Taking? Authorizing Provider  meclizine (ANTIVERT) 25 MG tablet Take 1 tablet (25 mg total) by mouth 3 (three) times daily as needed for dizziness. 10/09/19   Wurst, [place], PA-C  Multiple Vitamin (MULTIVITAMIN)  capsule Take 1 capsule by mouth daily.    [provider]  ondansetron (ZOFRAN) 4 MG tablet Take 1 tablet (4 mg total) by mouth every 6 (six) hours. 10/09/19   Wurst, [place], PA-C  sertraline (ZOLOFT) 100 MG tablet Take 2 and one half tablets daily 04/12/17   Myrlene Broker, MD  dicyclomine (BENTYL) 20 MG tablet Take 1 tablet (20 mg total) by mouth 2 (two) times daily. 03/24/19 10/09/19  Durward Parcel, FNP  linaclotide (LINZESS) 290 MCG CAPS capsule Take 290 mcg by mouth daily before breakfast.  10/09/19  [provider]    Family History Family History  Problem Relation Age of Onset   Depression Maternal Grandfather    Anxiety disorder Mother    Hypertension Mother    ADD / ADHD Brother    ADD / ADHD Maternal Aunt    Alcohol abuse Maternal Grandmother    Bipolar disorder Maternal Grandmother    Breast cancer Maternal Grandmother    Heart disease Maternal Grandmother    Dementia Paternal Grandmother    Hip fracture Paternal Grandmother    Breast cancer Paternal Grandmother    Seizures Cousin    ADD /  ADHD Maternal Aunt    Drug abuse Neg Hx    OCD Neg Hx    Paranoid behavior Neg Hx    Schizophrenia Neg Hx    Sexual abuse Neg Hx    Physical abuse Neg Hx     Social History Social History   Tobacco Use   Smoking status: Never   Smokeless tobacco: Never  Substance Use Topics   Alcohol use: Yes    Alcohol/week: 1.0 standard drink of alcohol    Types: 1 Cans of beer per week    Comment: i bottle of beer once per week   Drug use: No     Allergies   Gluten meal   Review of Systems Review of Systems Per HPI  Physical Exam Triage Vital Signs ED Triage Vitals  Enc Vitals Group     BP 09/16/21 1332 119/80     Pulse Rate 09/16/21 1332 86     Resp 09/16/21 1332 18     Temp 09/16/21 1332 98.3 F (36.8 C)     Temp Source 09/16/21 1332 Oral     SpO2 09/16/21 1332 98 %     Weight --      Height --      Head Circumference --      Peak Flow --       Pain Score 09/16/21 1333 1     Pain Loc --      Pain Edu? --      Excl. in GC? --    No data found.  Updated Vital Signs BP 119/80 (BP Location: Right Arm)   Pulse 86   Temp 98.3 F (36.8 C) (Oral)   Resp 18   LMP 08/30/2021 (Approximate)   SpO2 98%   Breastfeeding No   Visual Acuity Right Eye Distance:   Left Eye Distance:   Bilateral Distance:    Right Eye Near:   Left Eye Near:    Bilateral Near:     Physical Exam Vitals and nursing note reviewed.  Constitutional:      Appearance: Normal appearance. She is not ill-appearing.  HENT:     Head: Atraumatic.     Right Ear: Tympanic membrane normal.     Left Ear: Tympanic membrane normal.     Nose: Nose normal.     Mouth/Throat:     Mouth: Mucous membranes are moist.     Comments: Posterior oropharyngeal erythema.  No tonsillar edema, exudates.  Uvula midline, oral airway patent Eyes:     Extraocular Movements: Extraocular movements intact.     Conjunctiva/sclera: Conjunctivae normal.  Cardiovascular:     Rate and Rhythm: Normal rate and regular rhythm.     Heart sounds: Normal heart sounds.  Pulmonary:     Effort: Pulmonary effort is normal.     Breath sounds: Normal breath sounds. No wheezing or rales.  Musculoskeletal:        General: Normal range of motion.     Cervical back: Normal range of motion and neck supple.  Skin:    General: Skin is warm and dry.  Neurological:     Mental Status: She is alert and oriented to person, place, and time.     Motor: No weakness.     Gait: Gait normal.  Psychiatric:        Mood and Affect: Mood normal.        Thought Content: Thought content normal.        Judgment: Judgment normal.  UC Treatments / Results  Labs (all labs ordered are listed, but only abnormal results are displayed) Labs Reviewed  POCT RAPID STREP A (OFFICE)    EKG   Radiology No results found.  Procedures Procedures (including critical care time)  Medications Ordered in  UC Medications - No data to display  Initial Impression / Assessment and Plan / UC Course  I have reviewed the triage vital signs and the nursing notes.  Pertinent labs & imaging results that were available during my care of the patient were reviewed by me and considered in my medical decision making (see chart for details).     Suspect allergies versus viral illness, declines strep or viral testing today.  Discussed starting good allergy regimen with Zyrtec and Flonase, salt water gargles, Chloraseptic spray as needed.  Return for worsening symptoms.  Final Clinical Impressions(s) / UC Diagnoses   Final diagnoses:  Sore throat     Discharge Instructions      Start taking Zyrtec, Flonase twice daily, Sudafed as needed.  You may also use throat lozenges, Chloraseptic spray, salt water gargles    ED Prescriptions   None    PDMP not reviewed this encounter.   Particia Nearing, [place] 09/16/21 1358

## 2021-09-17 DIAGNOSIS — F411 Generalized anxiety disorder: Secondary | ICD-10-CM | POA: Diagnosis not present

## 2021-09-17 DIAGNOSIS — R69 Illness, unspecified: Secondary | ICD-10-CM | POA: Diagnosis not present

## 2021-09-18 DIAGNOSIS — R69 Illness, unspecified: Secondary | ICD-10-CM | POA: Diagnosis not present

## 2021-09-25 DIAGNOSIS — R69 Illness, unspecified: Secondary | ICD-10-CM | POA: Diagnosis not present

## 2021-10-09 DIAGNOSIS — R69 Illness, unspecified: Secondary | ICD-10-CM | POA: Diagnosis not present

## 2021-11-02 DIAGNOSIS — R69 Illness, unspecified: Secondary | ICD-10-CM | POA: Diagnosis not present

## 2021-12-09 DIAGNOSIS — F339 Major depressive disorder, recurrent, unspecified: Secondary | ICD-10-CM | POA: Diagnosis not present

## 2021-12-09 DIAGNOSIS — R69 Illness, unspecified: Secondary | ICD-10-CM | POA: Diagnosis not present

## 2021-12-22 DIAGNOSIS — R69 Illness, unspecified: Secondary | ICD-10-CM | POA: Diagnosis not present

## 2021-12-22 DIAGNOSIS — F909 Attention-deficit hyperactivity disorder, unspecified type: Secondary | ICD-10-CM | POA: Diagnosis not present

## 2021-12-22 DIAGNOSIS — F411 Generalized anxiety disorder: Secondary | ICD-10-CM | POA: Diagnosis not present

## 2021-12-22 DIAGNOSIS — F422 Mixed obsessional thoughts and acts: Secondary | ICD-10-CM | POA: Diagnosis not present

## 2021-12-22 DIAGNOSIS — F39 Unspecified mood [affective] disorder: Secondary | ICD-10-CM | POA: Diagnosis not present

## 2022-01-08 DIAGNOSIS — R41840 Attention and concentration deficit: Secondary | ICD-10-CM | POA: Diagnosis not present

## 2022-01-08 DIAGNOSIS — F422 Mixed obsessional thoughts and acts: Secondary | ICD-10-CM | POA: Diagnosis not present

## 2022-01-08 DIAGNOSIS — R69 Illness, unspecified: Secondary | ICD-10-CM | POA: Diagnosis not present

## 2022-01-08 DIAGNOSIS — F39 Unspecified mood [affective] disorder: Secondary | ICD-10-CM | POA: Diagnosis not present

## 2022-01-08 DIAGNOSIS — F331 Major depressive disorder, recurrent, moderate: Secondary | ICD-10-CM | POA: Diagnosis not present

## 2022-01-18 DIAGNOSIS — Z01419 Encounter for gynecological examination (general) (routine) without abnormal findings: Secondary | ICD-10-CM | POA: Diagnosis not present

## 2022-01-18 DIAGNOSIS — N760 Acute vaginitis: Secondary | ICD-10-CM | POA: Diagnosis not present

## 2022-01-18 DIAGNOSIS — Z6823 Body mass index (BMI) 23.0-23.9, adult: Secondary | ICD-10-CM | POA: Diagnosis not present

## 2022-02-04 DIAGNOSIS — F909 Attention-deficit hyperactivity disorder, unspecified type: Secondary | ICD-10-CM | POA: Diagnosis not present

## 2022-02-04 DIAGNOSIS — F411 Generalized anxiety disorder: Secondary | ICD-10-CM | POA: Diagnosis not present

## 2022-02-04 DIAGNOSIS — F39 Unspecified mood [affective] disorder: Secondary | ICD-10-CM | POA: Diagnosis not present

## 2022-02-04 DIAGNOSIS — R69 Illness, unspecified: Secondary | ICD-10-CM | POA: Diagnosis not present

## 2022-02-04 DIAGNOSIS — Z630 Problems in relationship with spouse or partner: Secondary | ICD-10-CM | POA: Diagnosis not present

## 2022-02-04 DIAGNOSIS — Z79899 Other long term (current) drug therapy: Secondary | ICD-10-CM | POA: Diagnosis not present

## 2022-02-04 DIAGNOSIS — F422 Mixed obsessional thoughts and acts: Secondary | ICD-10-CM | POA: Diagnosis not present

## 2022-06-11 DIAGNOSIS — F411 Generalized anxiety disorder: Secondary | ICD-10-CM | POA: Diagnosis not present

## 2022-06-11 DIAGNOSIS — F331 Major depressive disorder, recurrent, moderate: Secondary | ICD-10-CM | POA: Diagnosis not present

## 2022-11-02 ENCOUNTER — Encounter: Payer: Self-pay | Admitting: Emergency Medicine

## 2022-11-02 ENCOUNTER — Ambulatory Visit
Admission: EM | Admit: 2022-11-02 | Discharge: 2022-11-02 | Disposition: A | Discharge status: Home / Self Care | Payer: BC Managed Care – PPO

## 2022-11-02 DIAGNOSIS — J309 Allergic rhinitis, unspecified: Secondary | ICD-10-CM | POA: Diagnosis not present

## 2022-11-02 MED ORDER — CETIRIZINE HCL 10 MG PO TABS: 10.0000 mg | ORAL_TABLET | Freq: Every day | ORAL | 0 refills | Status: DC

## 2022-11-02 MED ORDER — FLUTICASONE PROPIONATE 50 MCG/ACT NA SUSP: 2.0000 | Freq: Every day | NASAL | 0 refills | Status: DC

## 2022-11-02 NOTE — ED Provider Notes (Signed)
[facility]    CSN: [id number] Arrival date & time: 11/02/22  1318      History   Chief Complaint No chief complaint on file.   HPI Theresa Jackson is a 32 y.o. female.   The history is provided by the patient.   Patient presents for complaints of "congestion.  States symptoms have been present for the past 2 weeks.  Patient states that every time she leans forward, she can feel "congestion" rushing in her head.  She also states that she can "smell the congestion."  She also complains of headaches.  She denies fever, chills, runny nose, ear pain, cough, abdominal pain, nausea, vomiting, or diarrhea.  Patient states that she used Zicam nasal spray with minimal relief.  Past Medical History:  Diagnosis Date   ADHD (attention deficit hyperactivity disorder)    Anxiety    Celiac disease    Headache(784.0)    Hx of pre-eclampsia in prior pregnancy, currently pregnant    Obsessive-compulsive disorder    Seizures (HCC)     Patient Active Problem List   Diagnosis Date Noted   Normal pregnancy 12/24/2016   SVD (spontaneous vaginal delivery) 12/24/2016   Indication for care in labor or delivery 12/16/2013   Otitis media 09/11/2012   Sore throat 09/11/2012   ADHD (attention deficit hyperactivity disorder) 06/16/2012   OCD (obsessive compulsive disorder) 06/16/2012   Generalized anxiety disorder 06/16/2012   Family history of benign essential tremor 06/16/2012    Past Surgical History:  Procedure Laterality Date   TONSILLECTOMY     TOOTH EXTRACTION     Tubes in Ears      OB History     Gravida  2   Para  2   Term  2   Preterm      AB      Living  2      SAB      IAB      Ectopic      Multiple  0   Live Births  2            Home Medications    Prior to Admission medications   Medication Sig Start Date End Date Taking? Authorizing Provider  cetirizine (ZYRTEC) 10 MG tablet Take 1 tablet (10 mg total) by mouth daily.  11/02/22  Yes Adison Reifsteck-Warren, Sadie Haber, NP  FLUoxetine (PROZAC) 40 MG capsule Take 40 mg by mouth daily.   Yes [provider]  fluticasone (FLONASE) 50 MCG/ACT nasal spray Place 2 sprays into both nostrils daily. 11/02/22  Yes Tabari Volkert-Warren, Sadie Haber, NP  QUEtiapine (SEROQUEL XR) 200 MG 24 hr tablet Take 200 mg by mouth at bedtime.   Yes [provider]  meclizine (ANTIVERT) 25 MG tablet Take 1 tablet (25 mg total) by mouth 3 (three) times daily as needed for dizziness. 10/09/19   Wurst, [place], PA-C  Multiple Vitamin (MULTIVITAMIN) capsule Take 1 capsule by mouth daily.    [provider]  ondansetron (ZOFRAN) 4 MG tablet Take 1 tablet (4 mg total) by mouth every 6 (six) hours. 10/09/19   Wurst, [place], PA-C  sertraline (ZOLOFT) 100 MG tablet Take 2 and one half tablets daily 04/12/17   Myrlene Broker, MD  dicyclomine (BENTYL) 20 MG tablet Take 1 tablet (20 mg total) by mouth 2 (two) times daily. 03/24/19 10/09/19  Avegno, Zachery Dakins, FNP  linaclotide (LINZESS) 290 MCG CAPS capsule Take 290 mcg by mouth daily before breakfast.  10/09/19  [provider]    Family History Family History  Problem Relation Age of Onset   Depression Maternal Grandfather    Anxiety disorder Mother    Hypertension Mother    ADD / ADHD Brother    ADD / ADHD Maternal Aunt    Alcohol abuse Maternal Grandmother    Bipolar disorder Maternal Grandmother    Breast cancer Maternal Grandmother    Heart disease Maternal Grandmother    Dementia Paternal Grandmother    Hip fracture Paternal Grandmother    Breast cancer Paternal Grandmother    Seizures Cousin    ADD / ADHD Maternal Aunt    Drug abuse Neg Hx    OCD Neg Hx    Paranoid behavior Neg Hx    Schizophrenia Neg Hx    Sexual abuse Neg Hx    Physical abuse Neg Hx     Social History Social History   Tobacco Use   Smoking status: Never   Smokeless tobacco: Never  Substance Use Topics   Alcohol use: Yes    Alcohol/week:  1.0 standard drink of alcohol    Types: 1 Cans of beer per week    Comment: i bottle of beer once per week   Drug use: No     Allergies   Gluten meal   Review of Systems Review of Systems Per HPI  Physical Exam Triage Vital Signs ED Triage Vitals  Encounter Vitals Group     BP 11/02/22 1418 123/66     Systolic BP Percentile --      Diastolic BP Percentile --      Pulse Rate 11/02/22 1418 80     Resp 11/02/22 1418 18     Temp 11/02/22 1418 98.5 F (36.9 C)     Temp Source 11/02/22 1418 Oral     SpO2 11/02/22 1418 99 %     Weight --      Height --      Head Circumference --      Peak Flow --      Pain Score 11/02/22 1420 6     Pain Loc --      Pain Education --      Exclude from Growth Chart --    No data found.  Updated Vital Signs BP 123/66 (BP Location: Right Arm)   Pulse 80   Temp 98.5 F (36.9 C) (Oral)   Resp 18   LMP 10/16/2022 (Exact Date)   SpO2 99%   Visual Acuity Right Eye Distance:   Left Eye Distance:   Bilateral Distance:    Right Eye Near:   Left Eye Near:    Bilateral Near:     Physical Exam Vitals and nursing note reviewed.  Constitutional:      General: She is not in acute distress.    Appearance: Normal appearance.  HENT:     Head: Normocephalic.     Right Ear: Tympanic membrane, ear canal and external ear normal.     Left Ear: Tympanic membrane, ear canal and external ear normal.     Nose: Nose normal.     Right Turbinates: Enlarged and swollen.     Left Turbinates: Enlarged and swollen.     Right Sinus: No maxillary sinus tenderness or frontal sinus tenderness.     Left Sinus: No maxillary sinus tenderness or frontal sinus tenderness.     Mouth/Throat:     Lips: Pink.     Mouth: Mucous membranes are moist.  Pharynx: Oropharynx is clear. Uvula midline. No pharyngeal swelling or posterior oropharyngeal erythema.  Eyes:     Extraocular Movements: Extraocular movements intact.     Conjunctiva/sclera: Conjunctivae normal.      Pupils: Pupils are equal, round, and reactive to light.  Cardiovascular:     Rate and Rhythm: Normal rate and regular rhythm.     Pulses: Normal pulses.     Heart sounds: Normal heart sounds.  Pulmonary:     Effort: Pulmonary effort is normal. No respiratory distress.     Breath sounds: Normal breath sounds. No stridor. No wheezing, rhonchi or rales.  Abdominal:     General: Bowel sounds are normal.     Palpations: Abdomen is soft.     Tenderness: There is no abdominal tenderness.  Musculoskeletal:     Cervical back: Normal range of motion.  Lymphadenopathy:     Cervical: No cervical adenopathy.  Skin:    General: Skin is warm and dry.  Neurological:     General: No focal deficit present.     Mental Status: She is alert and oriented to person, place, and time.  Psychiatric:        Mood and Affect: Mood normal.        Behavior: Behavior normal.      UC Treatments / Results  Labs (all labs ordered are listed, but only abnormal results are displayed) Labs Reviewed - No data to display  EKG   Radiology No results found.  Procedures Procedures (including critical care time)  Medications Ordered in UC Medications - No data to display  Initial Impression / Assessment and Plan / UC Course  I have reviewed the triage vital signs and the nursing notes.  Pertinent labs & imaging results that were available during my care of the patient were reviewed by me and considered in my medical decision making (see chart for details).  Patient is well-appearing, she is in no acute distress, vital signs are stable.  Suspect allergic rhinitis.  Will treat with fluticasone 50 micro nasal spray and cetirizine 10 mg.  Supportive care recommendations were provided and discussed with the patient to include increasing fluids, allowing for plenty of rest, use of normal saline nasal spray throughout the day, and over-the-counter analgesics for pain or discomfort.  Patient was given strict  follow-up precautions.  Patient is in agreement with this plan of care and verbalizes understanding.  All questions were answered.  Patient stable for discharge.   Final Clinical Impressions(s) / UC Diagnoses   Final diagnoses:  Allergic rhinitis, unspecified seasonality, unspecified trigger     Discharge Instructions      Take medication as directed. Increase fluids and get plenty of rest. May take over-the-counter ibuprofen or Tylenol as needed for pain, fever, or general discomfort. Recommend normal saline nasal spray to help with nasal congestion throughout the day. If symptoms fail to improve with this treatment, please follow-up in this clinic or with your primary care physician for further evaluation. Follow-up as needed.     ED Prescriptions     Medication Sig Dispense Auth. Provider   cetirizine (ZYRTEC) 10 MG tablet Take 1 tablet (10 mg total) by mouth daily. 30 tablet Amir Glaus-Warren, Sadie Haber, NP   fluticasone (FLONASE) 50 MCG/ACT nasal spray Place 2 sprays into both nostrils daily. 16 g Christopherjohn Schiele-Warren, Sadie Haber, NP      PDMP not reviewed this encounter.   Abran Cantor, NP 11/02/22 1435

## 2022-11-02 NOTE — Discharge Instructions (Signed)
Take medication as directed. Increase fluids and get plenty of rest. May take over-the-counter ibuprofen or Tylenol as needed for pain, fever, or general discomfort. Recommend normal saline nasal spray to help with nasal congestion throughout the day. If symptoms fail to improve with this treatment, please follow-up in this clinic or with your primary care physician for further evaluation. Follow-up as needed.

## 2022-11-02 NOTE — ED Triage Notes (Signed)
States every time she bends over, she feels congestion "rushing in her head" x 2 weeks.  States she can smell the congestion.

## 2022-12-08 DIAGNOSIS — N911 Secondary amenorrhea: Secondary | ICD-10-CM | POA: Diagnosis not present

## 2022-12-15 LAB — HEPATITIS C ANTIBODY: HCV Ab: NEGATIVE

## 2022-12-15 LAB — OB RESULTS CONSOLE HEPATITIS B SURFACE ANTIGEN: Hepatitis B Surface Ag: NEGATIVE

## 2022-12-15 LAB — OB RESULTS CONSOLE HIV ANTIBODY (ROUTINE TESTING): HIV: NONREACTIVE

## 2022-12-15 LAB — OB RESULTS CONSOLE RPR: RPR: NONREACTIVE

## 2022-12-15 LAB — OB RESULTS CONSOLE RUBELLA ANTIBODY, IGM: Rubella: NON-IMMUNE/NOT IMMUNE

## 2022-12-27 LAB — OB RESULTS CONSOLE GC/CHLAMYDIA
Chlamydia: NEGATIVE
Neisseria Gonorrhea: NEGATIVE

## 2023-04-06 NOTE — L&D Delivery Note (Signed)
 Delivery Note At 4:18 PM a viable female was delivered via  (Presentation:    ROA ).  APGAR: pend; weight pend.   Placenta status: pathology for IUGR.  Cord: 3 vessel  with the following complications: none  Anesthesia: Epidural Episiotomy:  None Lacerations:  None Suture Repair: n/a Est. Blood Loss (mL):  96 cc  Mom to postpartum.  Baby to Couplet care / Skin to Skin.  Lyn Henri 07/02/2023, 4:37 PM

## 2023-06-24 LAB — OB RESULTS CONSOLE GBS: GBS: NEGATIVE

## 2023-07-01 ENCOUNTER — Encounter (HOSPITAL_COMMUNITY): Payer: Self-pay | Admitting: Obstetrics and Gynecology

## 2023-07-01 ENCOUNTER — Telehealth (HOSPITAL_COMMUNITY): Payer: Self-pay | Admitting: *Deleted

## 2023-07-01 ENCOUNTER — Encounter (HOSPITAL_COMMUNITY): Payer: Self-pay | Admitting: *Deleted

## 2023-07-01 ENCOUNTER — Other Ambulatory Visit: Payer: Self-pay

## 2023-07-01 ENCOUNTER — Inpatient Hospital Stay (HOSPITAL_COMMUNITY)
Admission: AD | Admit: 2023-07-01 | Discharge: 2023-07-04 | DRG: 807 | Disposition: A | Discharge status: Home / Self Care | Attending: Obstetrics and Gynecology | Admitting: Obstetrics and Gynecology

## 2023-07-01 DIAGNOSIS — Z79899 Other long term (current) drug therapy: Secondary | ICD-10-CM | POA: Diagnosis not present

## 2023-07-01 DIAGNOSIS — Z3A37 37 weeks gestation of pregnancy: Secondary | ICD-10-CM | POA: Diagnosis not present

## 2023-07-01 DIAGNOSIS — F32A Depression, unspecified: Secondary | ICD-10-CM | POA: Diagnosis present

## 2023-07-01 DIAGNOSIS — Z8249 Family history of ischemic heart disease and other diseases of the circulatory system: Secondary | ICD-10-CM

## 2023-07-01 DIAGNOSIS — Z23 Encounter for immunization: Secondary | ICD-10-CM

## 2023-07-01 DIAGNOSIS — F419 Anxiety disorder, unspecified: Secondary | ICD-10-CM | POA: Diagnosis present

## 2023-07-01 DIAGNOSIS — O365990 Maternal care for other known or suspected poor fetal growth, unspecified trimester, not applicable or unspecified: Principal | ICD-10-CM | POA: Diagnosis present

## 2023-07-01 DIAGNOSIS — O26893 Other specified pregnancy related conditions, third trimester: Secondary | ICD-10-CM | POA: Diagnosis present

## 2023-07-01 DIAGNOSIS — O99344 Other mental disorders complicating childbirth: Secondary | ICD-10-CM | POA: Diagnosis present

## 2023-07-01 DIAGNOSIS — O365930 Maternal care for other known or suspected poor fetal growth, third trimester, not applicable or unspecified: Secondary | ICD-10-CM | POA: Diagnosis present

## 2023-07-01 LAB — CBC
HCT: 38.6 % (ref 36.0–46.0)
Hemoglobin: 13.4 g/dL (ref 12.0–15.0)
MCH: 34.4 pg — ABNORMAL HIGH (ref 26.0–34.0)
MCHC: 34.7 g/dL (ref 30.0–36.0)
MCV: 99 fL (ref 80.0–100.0)
Platelets: 297 10*3/uL (ref 150–400)
RBC: 3.9 MIL/uL (ref 3.87–5.11)
RDW: 12.7 % (ref 11.5–15.5)
WBC: 8.8 10*3/uL (ref 4.0–10.5)
nRBC: 0 % (ref 0.0–0.2)

## 2023-07-01 LAB — TYPE AND SCREEN
ABO/RH(D): O POS
Antibody Screen: NEGATIVE

## 2023-07-01 LAB — POCT FERN TEST: POCT Fern Test: POSITIVE

## 2023-07-01 LAB — HIV ANTIBODY (ROUTINE TESTING W REFLEX): HIV Screen 4th Generation wRfx: NONREACTIVE

## 2023-07-01 MED ORDER — HYDROXYZINE HCL 50 MG PO TABS: 50.0000 mg | ORAL_TABLET | Freq: Four times a day (QID) | ORAL | Status: DC | PRN

## 2023-07-01 MED ORDER — LACTATED RINGERS IV SOLN: 500.0000 mL | INTRAVENOUS | Status: DC | PRN

## 2023-07-01 MED ORDER — ONDANSETRON HCL 4 MG/2ML IJ SOLN: 4.0000 mg | Freq: Four times a day (QID) | INTRAMUSCULAR | Status: DC | PRN

## 2023-07-01 MED ORDER — OXYTOCIN BOLUS FROM INFUSION
333.0000 mL | Freq: Once | INTRAVENOUS | Status: AC
  Administered 2023-07-02: 333 mL via INTRAVENOUS

## 2023-07-01 MED ORDER — TERBUTALINE SULFATE 1 MG/ML IJ SOLN: 0.2500 mg | Freq: Once | INTRAMUSCULAR | Status: DC | PRN

## 2023-07-01 MED ORDER — OXYTOCIN-SODIUM CHLORIDE 30-0.9 UT/500ML-% IV SOLN
1.0000 m[IU]/min | INTRAVENOUS | Status: DC
  Administered 2023-07-01: 2 m[IU]/min via INTRAVENOUS
  Filled 2023-07-01: qty 500

## 2023-07-01 MED ORDER — LACTATED RINGERS IV SOLN: INTRAVENOUS | Status: DC

## 2023-07-01 MED ORDER — ACETAMINOPHEN 325 MG PO TABS
650.0000 mg | ORAL_TABLET | ORAL | Status: DC | PRN
  Administered 2023-07-02: 650 mg via ORAL
  Filled 2023-07-01: qty 2

## 2023-07-01 MED ORDER — OXYCODONE-ACETAMINOPHEN 5-325 MG PO TABS: 1.0000 | ORAL_TABLET | ORAL | Status: DC | PRN

## 2023-07-01 MED ORDER — LIDOCAINE HCL (PF) 1 % IJ SOLN: 30.0000 mL | INTRAMUSCULAR | Status: DC | PRN

## 2023-07-01 MED ORDER — SOD CITRATE-CITRIC ACID 500-334 MG/5ML PO SOLN: 30.0000 mL | ORAL | Status: DC | PRN

## 2023-07-01 MED ORDER — OXYCODONE-ACETAMINOPHEN 5-325 MG PO TABS: 2.0000 | ORAL_TABLET | ORAL | Status: DC | PRN

## 2023-07-01 MED ORDER — OXYTOCIN-SODIUM CHLORIDE 30-0.9 UT/500ML-% IV SOLN: 2.5000 [IU]/h | INTRAVENOUS | Status: DC

## 2023-07-01 NOTE — MAU Note (Signed)
 Pt says at 745pm- getting out of hammock- felt gush. Still feels fluid coming out- clear. Feels baby moving [facility]- Dr Lorane Gell VE- on Wed- 1 cm Denies HSV GBS- neg Feels some irreg UC's - 1/10

## 2023-07-01 NOTE — Telephone Encounter (Signed)
 Preadmission screen

## 2023-07-01 NOTE — MAU Note (Signed)

## 2023-07-01 NOTE — H&P (Addendum)
 OB History and Physical   Theresa Jackson is a 33 y.o. female [phone] presenting for leakage of fluid at [redacted]w[redacted]d.  SROM was confirmed in MAU. She has minimal contraction activity.  Pregnancy course is notable for anxiety/depression on buspar, quietapine, fluoxetine; IUGR with normal UA dopplers.  Most recent US at 36 weeks on 06/24/23: EFW = 2338g(5'2oz) = 9%, **HC < 3%**, AFI = 16.1 cm = 60%, BPP = 8/8, Umilical artery dopplers wnl.  Rh positive, GBS negative, panorama low risk. Rubella non-immune.   Prior pregnancies are complicated by postpartum preeclampsia (G1) and postpartum depression (G2).    OB History     Gravida  3   Para  2   Term  2   Preterm      AB      Living  2      SAB      IAB      Ectopic      Multiple  0   Live Births  2          Past Medical History:  Diagnosis Date   ADHD (attention deficit hyperactivity disorder)    Anxiety    Celiac disease    Depression    Headache(784.0)    Hx of pre-eclampsia in prior pregnancy, currently pregnant    Obsessive-compulsive disorder    Seizures (HCC)    Past Surgical History:  Procedure Laterality Date   TONSILLECTOMY     TOOTH EXTRACTION     Tubes in Ears     Family History: family history includes ADD / ADHD in her brother, maternal aunt, and maternal aunt; Alcohol abuse in her maternal grandmother; Anxiety disorder in her mother; Bipolar disorder in her maternal grandmother; Breast cancer in her maternal grandmother and paternal grandmother; Dementia in her paternal grandmother; Depression in her maternal grandfather; Heart disease in her maternal grandmother; Hip fracture in her paternal grandmother; Hypertension in her mother; Seizures in her cousin. Social History:  reports that she has never smoked. She has never used smokeless tobacco. She reports that she does not currently use alcohol after a past usage of about 1.0 standard drink of alcohol per week. She reports that she does not  use drugs.     Maternal Diabetes: No Genetic Screening: Normal Maternal Ultrasounds/Referrals: Normal Fetal Ultrasounds or other Referrals:  IUGR Maternal Substance Abuse:  No Significant Maternal Medications:  Buspar, fluoxetine, quietapine,  Significant Maternal Lab Results:  Group B Strep negative, rubella non-immune Other Comments:  None  Review of Systems - Patient denies fever, chills, SOB, CP, N/V/D.  History   Blood pressure 107/69, pulse 98, temperature 97.9 F (36.6 C), temperature source Oral, resp. rate 14, height 5' 2.5" (1.588 m), weight 73.3 kg, last menstrual period 10/16/2022, SpO2 100%. Exam Physical Exam   Gen: alert, well appearing, no distress Chest: nonlabored breathing CV: no peripheral edema Abdomen: soft, gravid  Ext: no evidence of DVT  Prenatal labs: ABO, Rh:   Antibody:   Rubella: Nonimmune (09/11 0000) RPR: Nonreactive (09/11 0000)  HBsAg: Negative (09/11 0000)  HIV: Non-reactive (09/11 0000)  GBS: Negative/-- (03/21 0000)   Assessment/Plan: Admit to Labor and Delivery GBS negative Augment with pitocin Epidural when desired Anticipate vaginal delivery Will monitor blood pressures and mood for Hx of PPD and PP preeclampsia.   Lyn Henri 07/01/2023, 10:33 PM

## 2023-07-02 ENCOUNTER — Encounter (HOSPITAL_COMMUNITY): Payer: Self-pay | Admitting: Obstetrics and Gynecology

## 2023-07-02 ENCOUNTER — Inpatient Hospital Stay (HOSPITAL_COMMUNITY): Admitting: Anesthesiology

## 2023-07-02 LAB — RPR: RPR Ser Ql: NONREACTIVE

## 2023-07-02 MED ORDER — FLUOXETINE HCL 20 MG PO CAPS
40.0000 mg | ORAL_CAPSULE | Freq: Every day | ORAL | Status: DC
  Filled 2023-07-02: qty 2

## 2023-07-02 MED ORDER — FENTANYL-BUPIVACAINE-NACL 0.5-0.125-0.9 MG/250ML-% EP SOLN
12.0000 mL/h | EPIDURAL | Status: DC | PRN
  Administered 2023-07-02: 12 mL/h via EPIDURAL
  Filled 2023-07-02: qty 250

## 2023-07-02 MED ORDER — SIMETHICONE 80 MG PO CHEW: 80.0000 mg | CHEWABLE_TABLET | ORAL | Status: DC | PRN

## 2023-07-02 MED ORDER — TETANUS-DIPHTH-ACELL PERTUSSIS 5-2.5-18.5 LF-MCG/0.5 IM SUSY: 0.5000 mL | PREFILLED_SYRINGE | Freq: Once | INTRAMUSCULAR | Status: DC

## 2023-07-02 MED ORDER — EPHEDRINE 5 MG/ML INJ: 10.0000 mg | INTRAVENOUS | Status: DC | PRN

## 2023-07-02 MED ORDER — ZOLPIDEM TARTRATE 5 MG PO TABS: 5.0000 mg | ORAL_TABLET | Freq: Every evening | ORAL | Status: DC | PRN

## 2023-07-02 MED ORDER — DIPHENHYDRAMINE HCL 25 MG PO CAPS: 25.0000 mg | ORAL_CAPSULE | Freq: Four times a day (QID) | ORAL | Status: DC | PRN

## 2023-07-02 MED ORDER — BUSPIRONE HCL 5 MG PO TABS
10.0000 mg | ORAL_TABLET | Freq: Three times a day (TID) | ORAL | Status: DC
  Administered 2023-07-02 – 2023-07-04 (×5): 10 mg via ORAL
  Filled 2023-07-02 (×5): qty 2

## 2023-07-02 MED ORDER — WITCH HAZEL-GLYCERIN EX PADS: 1.0000 | MEDICATED_PAD | CUTANEOUS | Status: DC | PRN

## 2023-07-02 MED ORDER — PHENYLEPHRINE 80 MCG/ML (10ML) SYRINGE FOR IV PUSH (FOR BLOOD PRESSURE SUPPORT): 80.0000 ug | PREFILLED_SYRINGE | INTRAVENOUS | Status: DC | PRN

## 2023-07-02 MED ORDER — BENZOCAINE-MENTHOL 20-0.5 % EX AERO
1.0000 | INHALATION_SPRAY | CUTANEOUS | Status: DC | PRN
  Administered 2023-07-02: 1 via TOPICAL
  Filled 2023-07-02: qty 56

## 2023-07-02 MED ORDER — IBUPROFEN 600 MG PO TABS
600.0000 mg | ORAL_TABLET | Freq: Four times a day (QID) | ORAL | Status: DC
  Administered 2023-07-02 – 2023-07-04 (×6): 600 mg via ORAL
  Filled 2023-07-02 (×8): qty 1

## 2023-07-02 MED ORDER — PRENATAL MULTIVITAMIN CH
1.0000 | ORAL_TABLET | Freq: Every day | ORAL | Status: DC
  Filled 2023-07-02: qty 1

## 2023-07-02 MED ORDER — ONDANSETRON HCL 4 MG PO TABS: 4.0000 mg | ORAL_TABLET | ORAL | Status: DC | PRN

## 2023-07-02 MED ORDER — DIPHENHYDRAMINE HCL 50 MG/ML IJ SOLN: 12.5000 mg | INTRAMUSCULAR | Status: DC | PRN

## 2023-07-02 MED ORDER — LIDOCAINE HCL (PF) 1 % IJ SOLN
INTRAMUSCULAR | Status: DC | PRN
  Administered 2023-07-02: 5 mL via EPIDURAL

## 2023-07-02 MED ORDER — SENNOSIDES-DOCUSATE SODIUM 8.6-50 MG PO TABS
2.0000 | ORAL_TABLET | ORAL | Status: DC
  Administered 2023-07-03 – 2023-07-04 (×2): 2 via ORAL
  Filled 2023-07-02 (×2): qty 2

## 2023-07-02 MED ORDER — DIBUCAINE (PERIANAL) 1 % EX OINT: 1.0000 | TOPICAL_OINTMENT | CUTANEOUS | Status: DC | PRN

## 2023-07-02 MED ORDER — ONDANSETRON HCL 4 MG/2ML IJ SOLN: 4.0000 mg | INTRAMUSCULAR | Status: DC | PRN

## 2023-07-02 MED ORDER — COCONUT OIL OIL: 1.0000 | TOPICAL_OIL | Status: DC | PRN

## 2023-07-02 MED ORDER — FLUOXETINE HCL 20 MG PO CAPS
40.0000 mg | ORAL_CAPSULE | Freq: Every day | ORAL | Status: DC
  Administered 2023-07-02 – 2023-07-03 (×2): 40 mg via ORAL
  Filled 2023-07-02 (×2): qty 2

## 2023-07-02 MED ORDER — LACTATED RINGERS IV SOLN: 500.0000 mL | Freq: Once | INTRAVENOUS | Status: DC

## 2023-07-02 MED ORDER — ACETAMINOPHEN 325 MG PO TABS
650.0000 mg | ORAL_TABLET | ORAL | Status: DC | PRN
  Administered 2023-07-02 – 2023-07-03 (×2): 650 mg via ORAL
  Filled 2023-07-02 (×2): qty 2

## 2023-07-02 MED ORDER — BUSPIRONE HCL 5 MG PO TABS
10.0000 mg | ORAL_TABLET | Freq: Three times a day (TID) | ORAL | Status: DC
  Filled 2023-07-02 (×3): qty 1

## 2023-07-02 NOTE — Anesthesia Procedure Notes (Signed)
 Epidural Patient location during procedure: OB Start time: 07/02/2023 8:34 AM End time: 07/02/2023 8:49 AM  Staffing Anesthesiologist: Trevor Iha, MD Performed: anesthesiologist   Preanesthetic Checklist Completed: patient identified, IV checked, site marked, risks and benefits discussed, surgical consent, monitors and equipment checked, pre-op evaluation and timeout performed  Epidural Patient position: sitting Prep: DuraPrep and site prepped and draped Patient monitoring: continuous pulse ox and blood pressure Approach: midline Location: L3-L4 Injection technique: LOR air  Needle:  Needle type: Tuohy  Needle gauge: 17 G Needle length: 9 cm and 9 Needle insertion depth: 5 cm cm Catheter type: closed end flexible Catheter size: 19 Gauge Catheter at skin depth: 10 cm Test dose: negative  Assessment Events: blood not aspirated, no cerebrospinal fluid, injection not painful, no injection resistance, no paresthesia and negative IV test  Additional Notes Patient identified. Risks/Benefits/Options discussed with patient including but not limited to bleeding, infection, nerve damage, paralysis, failed block, incomplete pain control, headache, blood pressure changes, nausea, vomiting, reactions to medication both or allergic, itching and postpartum back pain. Confirmed with bedside nurse the patient's most recent platelet count. Confirmed with patient that they are not currently taking any anticoagulation, have any bleeding history or any family history of bleeding disorders. Patient expressed understanding and wished to proceed. All questions were answered. Sterile technique was used throughout the entire procedure. Please see nursing notes for vital signs. Test dose was given through epidural needle and negative prior to continuing to dose epidural or start infusion. Warning signs of high block given to the patient including shortness of breath, tingling/numbness in hands, complete  motor block, or any concerning symptoms with instructions to call for help. Patient was given instructions on fall risk and not to get out of bed. All questions and concerns addressed with instructions to call with any issues.  1 Attempt (S) . Patient tolerated procedure well.

## 2023-07-02 NOTE — Progress Notes (Signed)
 At bedside for early decelerations.  Cervix 6/90/-2.  FHT recovered, moderate variability noted.   Patient feeling more pressure, but comfortable.   Anticipate active phase

## 2023-07-02 NOTE — Anesthesia Preprocedure Evaluation (Signed)
 Anesthesia Evaluation  Patient identified by MRN, date of birth, ID band Patient awake    Reviewed: Allergy & Precautions, NPO status , Patient's Chart, lab work & pertinent test results  Airway Mallampati: II  TM Distance: >3 FB Neck ROM: Full    Dental no notable dental hx. (+) Teeth Intact, Dental Advisory Given   Pulmonary neg pulmonary ROS   Pulmonary exam normal breath sounds clear to auscultation       Cardiovascular negative cardio ROS Normal cardiovascular exam Rhythm:Regular Rate:Normal     Neuro/Psych  Headaches PSYCHIATRIC DISORDERS Anxiety Depression       GI/Hepatic negative GI ROS, Neg liver ROS,,,  Endo/Other  negative endocrine ROS    Renal/GU negative Renal ROS     Musculoskeletal   Abdominal   Peds  Hematology Lab Results      Component                Value               Date                      WBC                      8.8                 07/01/2023                HGB                      13.4                07/01/2023                HCT                      38.6                07/01/2023                MCV                      99.0                07/01/2023                PLT                      297                 07/01/2023              Anesthesia Other Findings   Reproductive/Obstetrics (+) Pregnancy                             Anesthesia Physical Anesthesia Plan  ASA: 2  Anesthesia Plan: Epidural   Post-op Pain Management:    Induction:   PONV Risk Score and Plan:   Airway Management Planned:   Additional Equipment: None  Intra-op Plan:   Post-operative Plan:   Informed Consent: I have reviewed the patients History and Physical, chart, labs and discussed the procedure including the risks, benefits and alternatives for the proposed anesthesia with the patient or authorized representative who has indicated his/her understanding and acceptance.        Plan Discussed with:  Anesthesia Plan Comments: (37 wk G3P2 for LEA)        Anesthesia Quick Evaluation

## 2023-07-02 NOTE — Lactation Note (Signed)
 This note was copied from a baby's chart. Lactation Consultation Note  Patient Name: Theresa Jackson [id number] Date: 07/02/2023 Age:33 hours Reason for consult: Initial assessment;Early term 37-38.6wks;Infant < 6lbs;Breastfeeding assistance  P3- Infant was born at [redacted]w[redacted]d weighing 2450g. MOB declined LC services, but was willing for this LC to assist her at this time. MOB had already been provided with the LPI/low birth weight feeding plan and understands how to feed infant. MOB has been diagnosed with anxiety and depression. MOB reports to [facility] that she was only able to breastfeed her last child for 1 month due to needing to be placed on stronger medication for her depression/anxiety. MOB was curious on the safeness of buspar, quietapine, fluoxetine (medications she takes), with breastfeeding. LC reviewed how two of these medications are L2 drugs and one is an L3 drug. LC provided MOB copies of these medications from [facility] Med[facility] Lactation book so MOB could make an informed decision with her physician.   MOB requested for LC to assist with attempting a latch. LC placed infant on the right breast in the football hold. Despite stimulation attempts, infant was not willing to nurse at this time. This attempt was roughly 7 minutes long. LC set up the DEBP with size 18 mm flanges. MOB pumped for 15 minutes and collected 1.5 mL of EBM. LC praised MOB. Infant was not willing to drink the formula for FOB, so LC offered to assist. At first we used the yellow hospital throw away nipple that was provided to MOB, but infant would gag even while pace feeding. LC switched the nipple to the white Nfant nipple and infant took to the bottle immediatly. LC noted no gagging or spillage. Infant drank 11 mL for LC, but FOB continued to bottle feed her when LC left room.   MOB declined further LC services, so LC completed her at this time. LC reviewed the LPI/low birth weight feeding guidelines, the first 24 hr  birthday nap, day 2 cluster feeding, feeding infant on cue 8-12x in 24 hrs, not allowing infant to go over 3 hrs without a feeding, CDC milk storage guidelines and LC services handout. LC encouraged MOB to call for further assistance as needed.  Maternal Data Has patient been taught Hand Expression?: Yes Does the patient have breastfeeding experience prior to this delivery?: Yes How long did the patient breastfeed?: 1 month  Feeding Mother's Current Feeding Choice: Breast Milk and Formula Nipple Type: Nfant Standard Flow (white)  LATCH Score Latch: Too sleepy or reluctant, no latch achieved, no sucking elicited.  Audible Swallowing: None  Type of Nipple: Everted at rest and after stimulation  Comfort (Breast/Nipple): Soft / non-tender  Hold (Positioning): Full assist, staff holds infant at breast  LATCH Score: 4   Lactation Tools Discussed/Used Tools: Pump;Flanges Flange Size: 18 Breast pump type: Double-Electric Breast Pump;Manual Pump Education: Setup, frequency, and cleaning;Milk Storage Reason for Pumping: LPI/low birth weight feeding plan Pumping frequency: 15-20 min every 3 hrs Pumped volume: 1.5 mL  Interventions Interventions: Breast feeding basics reviewed;Assisted with latch;Breast compression;Adjust position;Support pillows;Position options;Expressed milk;Hand pump;DEBP;Education;Pace feeding;LC Services brochure;LPT handout/interventions  Discharge Discharge Education: Engorgement and breast care;Warning signs for feeding baby Pump: DEBP;Personal  Consult Status Consult Status: Complete (mother declined follow up) Date: 07/02/23    Dema Severin BS, IBCLC 07/02/2023, 10:59 PM

## 2023-07-02 NOTE — Plan of Care (Signed)
  Problem: Coping: Goal: Ability to verbalize concerns and feelings about labor and delivery will improve Outcome: Progressing   Problem: Life Cycle: Goal: Ability to make normal progression through stages of labor will improve Outcome: Progressing   Problem: Education: Goal: Knowledge of General Education information will improve Description: Including pain rating scale, medication(s)/side effects and non-pharmacologic comfort measures Outcome: Progressing   Problem: Health Behavior/Discharge Planning: Goal: Ability to manage health-related needs will improve Outcome: Progressing   Problem: Clinical Measurements: Goal: Ability to maintain clinical measurements within normal limits will improve Outcome: Progressing Goal: Will remain free from infection Outcome: Progressing Goal: Diagnostic test results will improve Outcome: Progressing Goal: Respiratory complications will improve Outcome: Progressing Goal: Cardiovascular complication will be avoided Outcome: Progressing   Problem: Activity: Goal: Risk for activity intolerance will decrease Outcome: Progressing   Problem: Nutrition: Goal: Adequate nutrition will be maintained Outcome: Progressing   Problem: Coping: Goal: Level of anxiety will decrease Outcome: Progressing   Problem: Elimination: Goal: Will not experience complications related to bowel motility Outcome: Progressing Goal: Will not experience complications related to urinary retention Outcome: Progressing   Problem: Pain Managment: Goal: General experience of comfort will improve and/or be controlled Outcome: Progressing   Problem: Safety: Goal: Ability to remain free from injury will improve Outcome: Progressing   Problem: Skin Integrity: Goal: Risk for impaired skin integrity will decrease Outcome: Progressing

## 2023-07-02 NOTE — Progress Notes (Signed)
 Labor Progress Note  Patient comfortable with epidural.    Cervix with slight progression to 3 / 50 / -3.  She has a history of rapid labor.  BP 101/69   Pulse 83   Temp 98.1 F (36.7 C) (Oral)   Resp 14   Ht 5' 2.5" (1.588 m)   Wt 73.3 kg   LMP 10/16/2022 (Exact Date)   SpO2 100%   BMI 29.07 kg/m   FHT cat 1 with accels.  Continue current management, pitocin currently 12 mU/min.   Anticipate vaginal delivery.   Theresa Jackson

## 2023-07-03 LAB — CBC
HCT: 36.9 % (ref 36.0–46.0)
Hemoglobin: 12.9 g/dL (ref 12.0–15.0)
MCH: 34.5 pg — ABNORMAL HIGH (ref 26.0–34.0)
MCHC: 35 g/dL (ref 30.0–36.0)
MCV: 98.7 fL (ref 80.0–100.0)
Platelets: 246 10*3/uL (ref 150–400)
RBC: 3.74 MIL/uL — ABNORMAL LOW (ref 3.87–5.11)
RDW: 12.5 % (ref 11.5–15.5)
WBC: 12.3 10*3/uL — ABNORMAL HIGH (ref 4.0–10.5)
nRBC: 0 % (ref 0.0–0.2)

## 2023-07-03 MED ORDER — QUETIAPINE FUMARATE 25 MG PO TABS
25.0000 mg | ORAL_TABLET | Freq: Every day | ORAL | Status: DC
  Administered 2023-07-03: 25 mg via ORAL
  Filled 2023-07-03 (×2): qty 1

## 2023-07-03 NOTE — Progress Notes (Signed)
 Pt desires to return to pre-pregnancy medication (seroquel 100 and prozac 80). Although LC said that she is ok to breastfeed on these meds, pt is not ok with "doping up" her baby. She has experience with drying up her breast milk with cabbage leaves

## 2023-07-03 NOTE — Discharge Instructions (Addendum)
 Mental Health Resources  What if I or someone I know is in crisis? If you are thinking about harming yourself or having thoughts of suicide, or if you know someone who is, seek help right away. Call your doctor or mental health care provider. Call 911 or go to a hospital emergency room to get immediate help, or ask a friend or family member to help you do these things; IF YOU ARE IN [place], YOU MAY GO TO WALK-IN URGENT CARE 24/7 at [facility] (see below) Call the [place] National Suicide Prevention Lifeline's toll-free, 24-hour hotline at 1-800-273-TALK 628 851 7914) or TTY: 1-800-799-4 TTY 351-029-4953) to talk to a trained counselor. If you are in crisis, make sure you are not left alone.  If someone else is in crisis, make sure he or she is not left alone  24 Hour Mental Health Services:  [facility]  [address], [place], [place] [address] [phone] or [phone] WALK-IN URGENT CARE 24/7 The[facility] Mobile Crisis: 365-647-1107 [place] National Suicide Hotline: 218-008-4323 [facility] [employer] (Domestic Violence, Rape & Victim Assistance)  904-340-8231 [facility] [employer] Crisis Services: [phone] (8am-4pm) or [phone] (after hours)       [facility] REC[facility]:  [address] [place], [place] [address] Mailing Address: [address], [place], [place] [address]  Hours: Mon-Fri 8AM-5PM  Phone: [phone] Fax: [phone]  Services Adult Services Advanced Access Walk-In Assessments - All Disabilities Routine Outpatient Therapy Psychiatry/Med Management Substance Abuse Intensive Outpatient (SAIOP) Mobile Crisis Assertive Community Treatment Team (ACTT) Jail Services Medication Assisted Treatment - MAT (Suboxone) Tailored Care Management (TCM)

## 2023-07-03 NOTE — Progress Notes (Signed)
 Postpartum Progress Note  Post Partum Day 1 s/p spontaneous vaginal delivery.  Patient reports well-controlled pain, ambulating without difficulty, voiding spontaneously, tolerating PO.  Vaginal bleeding is appropriate.   Objective: Blood pressure 101/65, pulse 70, temperature 97.9 F (36.6 C), temperature source Oral, resp. rate 16, height 5' 2.5" (1.588 m), weight 73.3 kg, last menstrual period 10/16/2022, SpO2 99%, unknown if currently breastfeeding.  Physical Exam:  General: alert and no distress Lochia: appropriate Uterine Fundus: firm DVT Evaluation: No evidence of DVT seen on physical exam.  Recent Labs    07/01/23 2226 07/03/23 0247  HGB 13.4 12.9  HCT 38.6 36.9    Assessment/Plan: Postpartum Day 1, s/p vaginal delivery. Continue routine postpartum care Lactation following, patient is hesitant to breastfeed due to anxiety with going back on her Seroquel and continuing buspar and fluoxetine. Anticipate discharge home PPD#1 or #2.   LOS: 2 days   Lyn Henri 07/03/2023, 9:32 AM

## 2023-07-03 NOTE — Progress Notes (Addendum)
 CSW received consult for hx of Anxiety, Depression, and OCD. CSW met with MOB to offer support and complete assessment. When CSW entered room, MOB was observed laying in hospital bed with infant. FOB was sitting nearby. CSW introduced self and requested to speak with MOB alone. MOB provided verbal consent to continue with FOB present. CSW explained reason for consult. MOB presented as calm, welcomed CSW visit, and remained engaged during encounter. MOB did not display any acute mental health symptoms during consult.  CSW inquired how MOB is feeling emotionally since infant's arrival. MOB reports ongoing symptoms of depression and anxiety which she feels have largely remained consistent throughout her pregnancy. MOB reports feeling "super down", sharing she often thinks the worst and has intrusive thoughts that something bad is going to happen. MOB inquired about MOB's mental health history. MOB acknowledged diagnoses of depression, anxiety, and OCD. MOB reports she was diagnosed with anxiety in 3rd grade, OCD in 6th grade, and depression at age 33. CSW inquired if MOB experienced postpartum depression symptoms after the birth of her other children. MOB reports endorsing postpartum depression after the birth of both of her other children, who are now ages 59 and 2. MOB shared that her symptoms started before they were born but increased postpartum. MOB reports that she is followed by psychiatry through [facility] behavioral [facility]. MOB was not able to recall the name of her psychiatrist. MOB reports she currently takes fluoxetine and Buspar and plans to resume Seroquel "to get ahead of things" postpartum while inpatient, which she had previously taken and tapered off of at [redacted] weeks gestation. MOB reports she plans to formula feed infant so that she can restart Seroquel without worrying about the medication posing a risk to infant.  MOB reports she has had the opportunity to discuss concerns with her OBGYN and lactation.  CSW commended MOB for prioritizing her mental health and making decisions she feels are best for her family's wellbeing as a whole. CSW discussed the correlation between a mother's health and infant's well being and encouraged MOB to prioritize self care while recovering postpartum.  CSW inquired about OCD symptoms during pregnancy. MOB reports her OCD symptoms manifest as "being obsessed" about bad things happening and will "touch things" a certain amount of times for emotional reassurance. MOB reports her OCD symptoms are behaviors that she "has always done." MOB described her thoughts of something bad happening as "intrusive" and cause her distress. CSW inquired about coping skills. MOB identified talking to FOB and her mother and relying on them for support as helpful coping skills. MOB shared that FOB will be on paternity leave for the next three weeks. FOB added that he works five minutes from home. CSW assessed for safety. MOB denied endorsing thoughts of harming her in children in the past and has not endorsed thoughts of harming infant. MOB reports she has endorsed thoughts of suicidal ideation in the past but has not acted on thoughts. MOB denied endorsing thoughts of self harm in the past week. MOB denied current HI.   CSW inquired if MOB is current with a therapist. MOB reports she has had a therapist in the past and her psychiatrist has recommended her restart therapy but she is not current with a therapist. MOB declined additional outpatient mental health resources and stated that she will plan to establish care with a therapist through [facility]. CSW inquired about psychiatry follow up. MOB reports she last met with her psychiatrist about 1 1/2 months ago and  is unsure if she has a follow up scheduled but likely has an upcoming appointment due to her psychiatrist knowing about her pregnancy. MOB reports her OBGYN plans to begin her on Seroquel and she will follow up with psychiatry through [facility].  MOB declined interest in additional behavioral health supports, such as as support groups/home visiting programs. CSW reviewed crisis resources which are also included in MOB's AVS.   CSW provided education regarding the baby blues period vs. perinatal mood disorders, discussed treatment and gave resources for mental health follow up if concerns arise.  CSW recommends self-evaluation during the postpartum time period using the New Mom Checklist from Postpartum Progress and encouraged MOB to contact a medical professional if symptoms are noted at any time.    CSW provided review of Sudden Infant Death Syndrome (SIDS) precautions.    MOB reports she has all needed items for infant, including a car seat and bassinet. MOB has chosen [facility] for infant's follow up care.  CSW identifies no further need for intervention and no barriers to discharge at this time.  Signed,  Norberto Sorenson, MSW, [place], Bridget Hartshorn 07/03/2023 12:38 PM

## 2023-07-03 NOTE — Anesthesia Postprocedure Evaluation (Signed)
 Anesthesia Post Note  Patient: Theresa Jackson  Procedure(s) Performed: AN AD HOC LABOR EPIDURAL     Patient location during evaluation: Mother Baby Anesthesia Type: Epidural Level of consciousness: awake and alert Pain management: pain level controlled Vital Signs Assessment: post-procedure vital signs reviewed and stable Respiratory status: spontaneous breathing, nonlabored ventilation and respiratory function stable Cardiovascular status: stable Postop Assessment: no headache, no backache, epidural receding, no apparent nausea or vomiting, patient able to bend at knees, able to ambulate and adequate PO intake Anesthetic complications: no   No notable events documented.  Last Vitals:  Vitals:   07/02/23 2343 07/03/23 0440  BP: 107/77 101/65  Pulse: 82 70  Resp: 18 16  Temp: 37 C 36.6 C  SpO2: 97% 99%    Last Pain:  Vitals:   07/03/23 0628  TempSrc:   PainSc: 0-No pain   Pain Goal:                   [employer]

## 2023-07-04 MED ORDER — MEASLES, MUMPS & RUBELLA VAC IJ SOLR
0.5000 mL | Freq: Once | INTRAMUSCULAR | Status: AC
  Administered 2023-07-04: 0.5 mL via SUBCUTANEOUS

## 2023-07-04 MED ORDER — FLUOXETINE HCL 40 MG PO CAPS: 80.0000 mg | ORAL_CAPSULE | Freq: Every day | ORAL | 1 refills | Status: AC

## 2023-07-04 MED ORDER — IBUPROFEN 600 MG PO TABS: 600.0000 mg | ORAL_TABLET | Freq: Four times a day (QID) | ORAL | 0 refills | Status: AC | PRN

## 2023-07-04 MED ORDER — QUETIAPINE FUMARATE 25 MG PO TABS
25.0000 mg | ORAL_TABLET | Freq: Every day | ORAL | 1 refills | Status: AC
Start: 2023-07-04 — End: ?

## 2023-07-04 MED ORDER — ACETAMINOPHEN 325 MG PO TABS: 650.0000 mg | ORAL_TABLET | Freq: Four times a day (QID) | ORAL | 0 refills | Status: AC | PRN

## 2023-07-04 MED ORDER — FLUOXETINE HCL 20 MG PO CAPS
80.0000 mg | ORAL_CAPSULE | Freq: Every day | ORAL | Status: DC
Start: 2023-07-04 — End: 2023-07-04
  Administered 2023-07-04: 80 mg via ORAL
  Filled 2023-07-04: qty 4

## 2023-07-04 NOTE — Discharge Summary (Signed)
 Postpartum Discharge Summary  Date of Service updated 07/04/23     Patient Name: Theresa Jackson [facility] DOB: 22-Jun-1990 MRN: [id number]  Date of admission: 07/01/2023 Delivery date:07/02/2023 Delivering provider: Damaris Hippo A Date of discharge: 07/04/2023  Admitting diagnosis: Intrauterine growth restriction (IUGR) affecting care of mother [O36.5990] Intrauterine pregnancy: [redacted]w[redacted]d     Secondary diagnosis:  Principal Problem:   Intrauterine growth restriction (IUGR) affecting care of mother  Additional problems:     Discharge diagnosis: Term Pregnancy Delivered and post partum depression                                               Post partum procedures: Augmentation: Pitocin Complications: None  Hospital course: Onset of Labor With Vaginal Delivery      33 y.o. yo G3P3003 at [redacted]w[redacted]d was admitted in Latent Labor on 07/01/2023. Labor course was complicated byPPROM  Membrane Rupture Time/Date: 7:45 PM,07/01/2023  Delivery Method:Vaginal, Spontaneous Operative Delivery: Episiotomy: None Lacerations:  None Patient had a postpartum course complicated by mild post partum depression. She was seen by MSW. The fluoxetine in increased to the prepregnancy dose of 80 mg every day, buspar continues at 10mg  BID and Seroquel is resumed at 25mg  HS.  She is ambulating, tolerating a regular diet, passing flatus, and urinating well. Patient is discharged home in stable condition on 07/04/23. An appointment with [facility] will be scheduled ASAP, she will have an appointment in our office in 3-4 days and a wellness check phone call from our office tomorrow.  Newborn Data: Birth date:07/02/2023 Birth time:4:18 PM Gender:Female Living status:Living Apgars:8 ,9  Weight:2450 g  Magnesium Sulfate received: No BMZ received: No Rhophylac:No MMR: T-DaP:Given prenatally Flu:  RSV Vaccine received:  Transfusion:No Immunizations administered: Immunization History  Administered Date(s)  Administered   Influenza,inj,Quad PF,6+ Mos 12/19/2013   Influenza-Unspecified 03/30/2018   Janssen (J&J) SARS-COV-2 Vaccination 06/23/2019   PFIZER(Purple Top)SARS-COV-2 Vaccination 03/10/2020   Tdap 04/29/2023    Physical exam  Vitals:   07/03/23 0937 07/03/23 1530 07/03/23 2306 07/04/23 0511  BP: 117/79 112/81 103/71 115/83  Pulse: 84 85 79 85  Resp: 17 16 16 16   Temp: 98.2 F (36.8 C) (!) 97.5 F (36.4 C)    TempSrc:  Oral    SpO2: 99% 100% 96% 100%  Weight:      Height:       General: alert, cooperative, and no distress Lochia: appropriate Uterine Fundus: firm Incision: Healing well with no significant drainage DVT Evaluation: No evidence of DVT seen on physical exam. Labs: Lab Results  Component Value Date   WBC 12.3 (H) 07/03/2023   HGB 12.9 07/03/2023   HCT 36.9 07/03/2023   MCV 98.7 07/03/2023   PLT 246 07/03/2023      Latest Ref Rng & Units 12/18/2013    5:58 AM  CMP  Glucose 70 - 99 mg/dL 90   BUN 6 - 23 mg/dL 9   Creatinine 4.09 - 8.11 mg/dL 9.14   Sodium 782 - 956 mEq/L 138   Potassium 3.7 - 5.3 mEq/L 4.1   Chloride 96 - 112 mEq/L 102   CO2 19 - 32 mEq/L 24   Calcium 8.4 - 10.5 mg/dL 7.1   Total Protein 6.0 - 8.3 g/dL 5.3   Total Bilirubin 0.3 - 1.2 mg/dL <2.1   Alkaline Phos 39 - 117 U/L 118  AST 0 - 37 U/L 61   ALT 0 - 35 U/L 61    Edinburgh Score:     No data to display            After visit meds:  Allergies as of 07/04/2023       Reactions   Gluten Meal Nausea And Vomiting        Medication List     STOP taking these medications    cetirizine 10 MG tablet Commonly known as: ZYRTEC   Doxylamine-Pyridoxine 10-10 MG Tbec   fluticasone 50 MCG/ACT nasal spray Commonly known as: FLONASE   meclizine 25 MG tablet Commonly known as: ANTIVERT   ondansetron 4 MG tablet Commonly known as: ZOFRAN   QUEtiapine 200 MG 24 hr tablet Commonly known as: SEROQUEL XR Replaced by: QUEtiapine 25 MG tablet   sertraline 100 MG  tablet Commonly known as: Zoloft       TAKE these medications    acetaminophen 325 MG tablet Commonly known as: Tylenol Take 2 tablets (650 mg total) by mouth every 6 (six) hours as needed (for pain scale < 4).   busPIRone 10 MG tablet Commonly known as: BUSPAR Take 10 mg by mouth 3 (three) times daily.   FLUoxetine 40 MG capsule Commonly known as: PROZAC Take 2 capsules (80 mg total) by mouth daily. What changed: how much to take   ibuprofen 600 MG tablet Commonly known as: ADVIL Take 1 tablet (600 mg total) by mouth every 6 (six) hours as needed.   multivitamin capsule Take 1 capsule by mouth daily.   QUEtiapine 25 MG tablet Commonly known as: SEROQUEL Take 1 tablet (25 mg total) by mouth at bedtime. Replaces: QUEtiapine 200 MG 24 hr tablet         Discharge home in stable condition Infant Feeding: Bottle Infant Disposition:home with mother Discharge instruction: per After Visit Summary and Postpartum booklet. Activity: Advance as tolerated. Pelvic rest for 6 weeks.  Diet: routine diet Anticipated Birth Control: Unsure Postpartum Appointment:6 weeks Additional Postpartum F/U: Postpartum Depression checkup Future Appointments:3-4 days Follow up Visit:      07/04/2023 Leslie Andrea, MD

## 2023-07-04 NOTE — Progress Notes (Signed)
 Post Partum Day 2 Subjective: up ad lib, voiding, tolerating PO, and + flatus Reports mild depression as D/W MSW yesterday. No HI/SI. Started Seroquel last pm. Slept well. Wants to go home. Objective: Blood pressure 115/83, pulse 85, temperature (!) 97.5 F (36.4 C), temperature source Oral, resp. rate 16, height 5' 2.5" (1.588 m), weight 73.3 kg, last menstrual period 10/16/2022, SpO2 100%, unknown if currently breastfeeding.  Physical Exam:  General: alert, cooperative, and no distress Lochia: appropriate Uterine Fundus: firm Incision: healing well DVT Evaluation: No evidence of DVT seen on physical exam.  Recent Labs    07/01/23 2226 07/03/23 0247  HGB 13.4 12.9  HCT 38.6 36.9    Assessment/Plan: Discharge home Fluoxetine was weaned to 40mg   during pregnancy. Will increase to 80mg , her prepregnancy dose. Continue Buspar 10mg  BID and Seroquel 25 mg @ HS. Will schedule with [facility] ASAP and office appt end of this week. D/W preeclampsia sxs (Hx of PP preeclampsia)  LOS: 3 days   Theresa Locus II, MD 07/04/2023, 7:15 AM

## 2023-07-04 NOTE — Progress Notes (Signed)
 CSW received a consult for Edinburgh score of 13.  Per chart review the weekend CSW met with MOB and discussed her mental health in great detail and provided resources.  CSW identifies no further need for intervention and no barriers to discharge at this time.  Enos Fling, Theresia Majors Clinical Social Worker [phone]

## 2023-07-05 ENCOUNTER — Telehealth (HOSPITAL_COMMUNITY): Payer: Self-pay

## 2023-07-05 LAB — SURGICAL PATHOLOGY

## 2023-07-05 NOTE — Telephone Encounter (Signed)
  Hospital inpatient EPDS = 13, question # 10=0 Never. CSW consult completed during hospital stay. Fax notification sent to Dr. Lorane Gell at [facility].   Signe Colt 07/05/23,1259

## 2023-07-08 ENCOUNTER — Inpatient Hospital Stay (HOSPITAL_COMMUNITY): Payer: Self-pay

## 2023-07-08 ENCOUNTER — Inpatient Hospital Stay (HOSPITAL_COMMUNITY): Admission: RE | Admit: 2023-07-08 | Payer: Self-pay | Source: Home / Self Care | Admitting: Obstetrics and Gynecology

## 2023-07-13 ENCOUNTER — Telehealth (HOSPITAL_COMMUNITY): Payer: Self-pay | Admitting: *Deleted

## 2023-07-13 NOTE — Telephone Encounter (Signed)
 07/13/2023  Name: Theresa Jackson MRN: [id number] DOB: Jun 20, 1990  Reason for Call:  Transition of Care Hospital Discharge Call  Contact Status: Patient Contact Status: Complete  Language assistant needed: Interpreter Mode: Interpreter Not Needed        Follow-Up Questions: Do You Have Any Concerns About Your Health As You Heal From Delivery?: No Do You Have Any Concerns About Your Infants Health?: No  Edinburgh Postnatal Depression Scale:  In the Past 7 Days: I have been able to laugh and see the funny side of things.: Definitely not so much now I have looked forward with enjoyment to things.: Definitely less than I used to I have blamed myself unnecessarily when things went wrong.: Yes, most of the time I have been anxious or worried for no good reason.: Yes, very often I have felt scared or panicky for no good reason.: Yes, quite a lot Things have been getting on top of me.: Yes, sometimes I haven't been coping as well as usual I have been so unhappy that I have had difficulty sleeping.: Not very often I have felt sad or miserable.: Yes, quite often I have been so unhappy that I have been crying.: Yes, quite often The thought of harming myself has occurred to me.: Never [name] Postnatal Depression Scale Total: (!) 20  PHQ2-9 Depression Scale:     Discharge Follow-up: Edinburgh score requires follow up?: Yes (Patient states she has already seen OB and has increased her medications, which seem to work for her. Patient also states that she is currently speaking with a therapist and psychiatrist, in which she has spoken to multiple times since delivery of baby.) Provider notified of Edinburgh score?: Yes Have you already been referred for a counseling appointment?: Yes Patient was advised of the following resources:: Support Group  Post-discharge interventions: Reviewed Newborn Safe Sleep Practices Maternal Mental Health Resources provided  [name]   07/13/2023 1658
